# Patient Record
Sex: Male | Born: 1965
Health system: Southern US, Community
[De-identification: ages and names within clinical notes are randomized; demographics above are authoritative.]

## PROBLEM LIST (undated history)

## (undated) DIAGNOSIS — K219 Gastro-esophageal reflux disease without esophagitis: Secondary | ICD-10-CM

## (undated) DIAGNOSIS — I2510 Atherosclerotic heart disease of native coronary artery without angina pectoris: Secondary | ICD-10-CM

## (undated) DIAGNOSIS — R55 Syncope and collapse: Secondary | ICD-10-CM

## (undated) DIAGNOSIS — I25810 Atherosclerosis of coronary artery bypass graft(s) without angina pectoris: Secondary | ICD-10-CM

## (undated) HISTORY — PX: TONSILLECTOMY: SUR1361

## (undated) HISTORY — DX: Syncope and collapse: R55

## (undated) HISTORY — DX: Atherosclerosis of coronary artery bypass graft(s) without angina pectoris: I25.810

## (undated) HISTORY — PX: CORONARY ANGIOPLASTY: SHX604

---

## 1998-12-11 DIAGNOSIS — I219 Acute myocardial infarction, unspecified: Secondary | ICD-10-CM | POA: Insufficient documentation

## 1998-12-11 HISTORY — PX: STENT PLACEMENT VASCULAR (ARMC HX): HXRAD1737

## 1998-12-11 HISTORY — DX: Acute myocardial infarction, unspecified: I21.9

## 1998-12-22 ENCOUNTER — Ambulatory Visit (HOSPITAL_COMMUNITY): Admission: RE | Admit: 1998-12-22 | Discharge: 1998-12-22 | Payer: Self-pay

## 2020-06-10 ENCOUNTER — Encounter: Payer: Self-pay | Admitting: Cardiology

## 2020-06-10 ENCOUNTER — Encounter: Payer: Self-pay | Admitting: Emergency Medicine

## 2020-06-10 ENCOUNTER — Other Ambulatory Visit: Payer: Self-pay

## 2020-06-10 ENCOUNTER — Ambulatory Visit: Payer: Commercial Managed Care - PPO | Admitting: Cardiology

## 2020-06-10 DIAGNOSIS — F17200 Nicotine dependence, unspecified, uncomplicated: Secondary | ICD-10-CM

## 2020-06-10 DIAGNOSIS — I10 Essential (primary) hypertension: Secondary | ICD-10-CM

## 2020-06-10 DIAGNOSIS — I255 Ischemic cardiomyopathy: Secondary | ICD-10-CM

## 2020-06-10 DIAGNOSIS — I2510 Atherosclerotic heart disease of native coronary artery without angina pectoris: Secondary | ICD-10-CM | POA: Insufficient documentation

## 2020-06-10 HISTORY — DX: Ischemic cardiomyopathy: I25.5

## 2020-06-10 HISTORY — DX: Atherosclerotic heart disease of native coronary artery without angina pectoris: I25.10

## 2020-06-10 HISTORY — DX: Essential (primary) hypertension: I10

## 2020-06-10 HISTORY — DX: Nicotine dependence, unspecified, uncomplicated: F17.200

## 2020-06-10 LAB — LIPID PANEL
Chol/HDL Ratio: 6.1 ratio — ABNORMAL HIGH (ref 0.0–5.0)
Cholesterol, Total: 194 mg/dL (ref 100–199)
HDL: 32 mg/dL — ABNORMAL LOW (ref >39)
LDL Chol Calc (NIH): 123 mg/dL — ABNORMAL HIGH (ref 0–99)
Triglycerides: 221 mg/dL — ABNORMAL HIGH (ref 0–149)
VLDL Cholesterol Cal: 39 mg/dL (ref 5–40)

## 2020-06-10 LAB — BASIC METABOLIC PANEL
BUN/Creatinine Ratio: 9 (ref 9–20)
BUN: 8 mg/dL (ref 6–24)
CO2: 21 mmol/L (ref 20–29)
Calcium: 9.5 mg/dL (ref 8.7–10.2)
Chloride: 103 mmol/L (ref 96–106)
Creatinine, Ser: 0.89 mg/dL (ref 0.76–1.27)
GFR calc Af Amer: 113 mL/min/{1.73_m2} (ref >59)
GFR calc non Af Amer: 98 mL/min/{1.73_m2} (ref >59)
Glucose: 100 mg/dL — ABNORMAL HIGH (ref 65–99)
Potassium: 4.2 mmol/L (ref 3.5–5.2)
Sodium: 140 mmol/L (ref 134–144)

## 2020-06-10 MED ORDER — ASPIRIN EC 81 MG PO TBEC
81.0000 mg | DELAYED_RELEASE_TABLET | Freq: Every day | ORAL | 3 refills | Status: AC
Start: 2020-06-10 — End: ?

## 2020-06-10 MED ORDER — ROSUVASTATIN CALCIUM 20 MG PO TABS
20.0000 mg | ORAL_TABLET | Freq: Every day | ORAL | 1 refills | Status: DC
Start: 2020-06-10 — End: 2020-06-15

## 2020-06-10 NOTE — Patient Instructions (Signed)
Medication Instructions:  Your physician has recommended you make the following change in your medication:   DECREASE: Aspirin 81 mg daily   START: Crestor 20 mg daily    *If you need a refill on your cardiac medications before your next appointment, please call your pharmacy*   Lab Work: Your physician recommends that you return for lab work today: bmp, lipid   If you have labs (blood work) drawn today and your tests are completely normal, you will receive your results only by: Marland Kitchen MyChart Message (if you have MyChart) OR . A paper copy in the mail If you have any lab test that is abnormal or we need to change your treatment, we will call you to review the results.   Testing/Procedures: None.    Follow-Up: At [facility], you and your health needs are our priority.  As part of our continuing mission to provide you with exceptional heart care, we have created designated Provider Care Teams.  These Care Teams include your primary Cardiologist (physician) and Advanced Practice Providers (APPs -  Physician Assistants and Nurse Practitioners) who all work together to provide you with the care you need, when you need it.  We recommend signing up for the patient portal called "MyChart".  Sign up information is provided on this After Visit Summary.  MyChart is used to connect with patients for Virtual Visits (Telemedicine).  Patients are able to view lab/test results, encounter notes, upcoming appointments, etc.  Non-urgent messages can be sent to your provider as well.   To learn more about what you can do with MyChart, go to [email or web address].    Your next appointment:   1 month(s)  The format for your next appointment:   In Person  Provider:   Gypsy Balsam, MD   Other Instructions  Rosuvastatin Tablets What is this medicine? ROSUVASTATIN (roe SOO va sta tin) is known as a HMG-CoA reductase inhibitor or 'statin'. It lowers cholesterol and triglycerides in the blood.  This drug may also reduce the risk of heart attack, stroke, or other health problems in patients with risk factors for heart disease. Diet and lifestyle changes are often used with this drug. This medicine may be used for other purposes; ask your health care provider or pharmacist if you have questions. COMMON BRAND NAME(S): Crestor What should I tell my health care provider before I take this medicine? They need to know if you have any of these conditions:  diabetes  if you often drink alcohol  history of stroke  kidney disease  liver disease  muscle aches or weakness  thyroid disease  an unusual or allergic reaction to rosuvastatin, other medicines, foods, dyes, or preservatives  pregnant or trying to get pregnant  breast-feeding How should I use this medicine? Take this medicine by mouth with a glass of water. Follow the directions on the prescription label. Do not cut, crush or chew this medicine. You can take this medicine with or without food. Take your doses at regular intervals. Do not take your medicine more often than directed. Talk to your pediatrician regarding the use of this medicine in children. While this drug may be prescribed for children as young as 57 years old for selected conditions, precautions do apply. Overdosage: If you think you have taken too much of this medicine contact a poison control center or emergency room at once. NOTE: This medicine is only for you. Do not share this medicine with others. What if I miss a dose? If you  miss a dose, take it as soon as you can. If your next dose is to be taken in less than 12 hours, then do not take the missed dose. Take the next dose at your regular time. Do not take double or extra doses. What may interact with this medicine? Do not take this medicine with any of the following medications:  herbal medicines like red yeast rice This medicine may also interact with the following medications:  alcohol  antacids  containing aluminum hydroxide or magnesium hydroxide  cyclosporine  other medicines for high cholesterol  some medicines for HIV infection  warfarin This list may not describe all possible interactions. Give your health care provider a list of all the medicines, herbs, non-prescription drugs, or dietary supplements you use. Also tell them if you smoke, drink alcohol, or use illegal drugs. Some items may interact with your medicine. What should I watch for while using this medicine? Visit your doctor or health care professional for regular check-ups. You may need regular tests to make sure your liver is working properly. Your health care professional may tell you to stop taking this medicine if you develop muscle problems. If your muscle problems do not go away after stopping this medicine, contact your health care professional. Do not become pregnant while taking this medicine. Women should inform their health care professional if they wish to become pregnant or think they might be pregnant. There is a potential for serious side effects to an unborn child. Talk to your health care professional or pharmacist for more information. Do not breast-feed an infant while taking this medicine. This medicine may increase blood sugar. Ask your healthcare provider if changes in diet or medicines are needed if you have diabetes. If you are going to need surgery or other procedure, tell your doctor that you are using this medicine. This drug is only part of a total heart-health program. Your doctor or a dietician can suggest a low-cholesterol and low-fat diet to help. Avoid alcohol and smoking, and keep a proper exercise schedule. This medicine may cause a decrease in Co-Enzyme Q-10. You should make sure that you get enough Co-Enzyme Q-10 while you are taking this medicine. Discuss the foods you eat and the vitamins you take with your health care professional. What side effects may I notice from receiving this  medicine? Side effects that you should report to your doctor or health care professional as soon as possible:  allergic reactions like skin rash, itching or hives, swelling of the face, lips, or tongue  confusion  joint pain  loss of memory  redness, blistering, peeling or loosening of the skin, including inside the mouth  signs and symptoms of high blood sugar such as being more thirsty or hungry or having to urinate more than normal. You may also feel very tired or have blurry vision.  signs and symptoms of muscle injury like dark urine; trouble passing urine or change in the amount of urine; unusually weak or tired; muscle pain or side or back pain  yellowing of the eyes or skin Side effects that usually do not require medical attention (report to your doctor or health care professional if they continue or are bothersome):  constipation  diarrhea  dizziness  gas  headache  nausea  stomach pain  trouble sleeping  upset stomach This list may not describe all possible side effects. Call your doctor for medical advice about side effects. You may report side effects to FDA at 1-800-FDA-1088. Where should  I keep my medicine? Keep out of the reach of children. Store at room temperature between 20 and 25 degrees C (68 and 77 degrees F). Keep container tightly closed (protect from moisture). Throw away any unused medicine after the expiration date. NOTE: This sheet is a summary. It may not cover all possible information. If you have questions about this medicine, talk to your doctor, pharmacist, or health care provider.  2020 Elsevier/Gold Standard (2018-09-19 08:25:08)

## 2020-06-10 NOTE — Progress Notes (Signed)
Cardiology Consultation:    Date:  06/10/2020   ID:  Calvin Rodriguez, DOB 1966-06-22, MRN [id number]  PCP:  Bonnita Nasuti, MD  Cardiologist:  Jenne Campus, MD   Referring MD: Zoila Shutter, NP   Chief Complaint  Patient presents with  . New Patient (Initial Visit)  I would like to be reestablished as a patient  History of Present Illness:    Calvin Rodriguez is a 54 y.o. male who is being seen today for the evaluation of coronary artery disease at the request of Zoila Shutter, NP.  I met Calvin Rodriguez first time more than 20 years ago.  He presented in the middle of the night at age of 50 with acute STEMI involving LAD.  He was transferred to [facility] in PT[facility] and stenting of proximal LAD has been done.  After that he follow-up with me but then he disappeared from follow-up about 6 years ago.  He is coming back to my office he would like to be establish as a patient in the office.  Since I seen him 20 years ago he started exercising on the regular basis he lost significant length of weight however his lack of on that and he is gaining weight again.  Sadly he still continues to smoke.  Overall he seems to be doing well described to have some exertional shortness of breath, but no chest pain, tightness, pressure, burning in the chest.  Past Medical History:  Diagnosis Date  . Heart attack ([place]) 12/1998    Past Surgical History:  Procedure Laterality Date  . STENT PLACEMENT VASCULAR ([facility] HX)  12/1998    Current Medications: Current Meds  Medication Sig  . sacubitril-valsartan (ENTRESTO) 24-26 MG Take 1 tablet by mouth 2 (two) times daily.  . [DISCONTINUED] aspirin 325 MG tablet Take 325 mg by mouth daily.     Allergies:   Patient has no known allergies.   Social History   Socioeconomic History  . Marital status: Single    Spouse name: Not on file  . Number of children: Not on file  . Years of education: Not on file  . Highest education  level: Not on file  Occupational History  . Not on file  Tobacco Use  . Smoking status: Current Every Day Smoker    Packs/day: 0.75    Types: Cigarettes  . Smokeless tobacco: Never Used  Vaping Use  . Vaping Use: Never used  Substance and Sexual Activity  . Alcohol use: Never  . Drug use: Never  . Sexual activity: Not on file  Other Topics Concern  . Not on file  Social History Narrative  . Not on file   Social Determinants of Health   Financial Resource Strain:   . Difficulty of Paying Living Expenses:   Food Insecurity:   . Worried About Charity fundraiser in the Last Year:   . Arboriculturist in the Last Year:   Transportation Needs:   . Film/video editor (Medical):   Marland Kitchen Lack of Transportation (Non-Medical):   Physical Activity:   . Days of Exercise per Week:   . Minutes of Exercise per Session:   Stress:   . Feeling of Stress :   Social Connections:   . Frequency of Communication with Friends and Family:   . Frequency of Social Gatherings with Friends and Family:   . Attends Religious Services:   . Active Member of Clubs or  Organizations:   . Attends Archivist Meetings:   Marland Kitchen Marital Status:      Family History: The patient's significant for premature coronary artery disease.  I was taking care of his father who passed in 2019.  He did have extensive coronary artery disease with bypass surgery valve replacement carotic endarterectomy and other problems. ROS:   Please see the history of present illness.    All 14 point review of systems negative except as described per history of present illness.  EKGs/Labs/Other Studies Reviewed:    The following studies were reviewed today: Apparently echocardiogram has been done which I do not have.  We will request a copy of it  EKG:  EKG is  ordered today.  The ekg ordered today demonstrates normal sinus rhythm left axis deviation nonspecific intraventricular conduction delay, Q waves in V1 V2  Recent  Labs: No results found for requested labs within last 8760 hours.  Recent Lipid Panel No results found for: CHOL, TRIG, HDL, CHOLHDL, VLDL, LDLCALC, LDLDIRECT  Physical Exam:    VS:  BP (!) 154/92 (BP Location: Right Arm, Patient Position: Sitting, Cuff Size: Normal)   Pulse 78   Ht _0  (1.753 m)   Wt 191 lb 6.4 oz (86.8 kg)   SpO2 97%   BMI 28.26 kg/m     Wt Readings from Last 3 Encounters:  06/10/20 191 lb 6.4 oz (86.8 kg)     GEN:  Well nourished, well developed in no acute distress HEENT: Normal NECK: No JVD; No carotid bruits LYMPHATICS: No lymphadenopathy CARDIAC: RRR, no murmurs, no rubs, no gallops RESPIRATORY:  Clear to auscultation without rales, wheezing or rhonchi  ABDOMEN: Soft, non-tender, non-distended MUSCULOSKELETAL:  No edema; No deformity  SKIN: Warm and dry NEUROLOGIC:  Alert and oriented x 3 PSYCHIATRIC:  Normal affect   ASSESSMENT:    1. Coronary artery disease involving native coronary artery of native heart without angina pectoris   2. Ischemic cardiomyopathy ejection fraction 30 to 35% as per patient   3. Smoking   4. Essential hypertension    PLAN:    In order of problems listed above:  1. Coronary artery disease status post PTCA and stenting of proximal LAD 20 years ago.  He is on 325 mg aspirin I will lower the dose to recommend a dose of 81 mg daily, he is also on Entresto 2426.  I will call primary care physician to get copy of his echocardiogram to recheck left ventricle ejection fraction.  He will also required addition of beta-blocker, however, I will make a decision about this after I review his echocardiogram as well as review his laboratory test.  The goal will be to put him on guideline directed medical therapy.  He need to be on 81 mg of aspirin, high intensity statin, Delene Loll is a good choice, beta-blocker.  We will gradually put him on appropriate medications.  After a few months we will repeat his echocardiogram to recheck left  ventricle ejection fraction.  If his left ventricle ejection fraction still in the neighborhood of 30 to 35% ICD need to be contemplated. 2. Smoking obviously huge problem.  He understands an issue he tried to quit he cut down significantly number of cigarettes to smoke but still ultimate goal is completely discontinuation of smoking.  We had a long discussion about this and he understand that. 3. Ischemic cardiomyopathy again plan as outlined above.  He will require beta-blocker. 4. Essential hypertension elevated today we will  check Chem-7 today as well as fasting lipid profile to decide about therapy. 5. Dyslipidemia I will initiate his Crestor 20 mg daily.   Medication Adjustments/Labs and Tests Ordered: Current medicines are reviewed at length with the patient today.  Concerns regarding medicines are outlined above.  Orders Placed This Encounter  Procedures  . Basic metabolic panel  . Lipid panel  . EKG 12-Lead   Meds ordered this encounter  Medications  . aspirin EC 81 MG tablet    Sig: Take 1 tablet (81 mg total) by mouth daily. Swallow whole.    Dispense:  90 tablet    Refill:  3  . rosuvastatin (CRESTOR) 20 MG tablet    Sig: Take 1 tablet (20 mg total) by mouth daily.    Dispense:  90 tablet    Refill:  1    Signed, Park Liter, MD, [facility]. 06/10/2020 9:22 AM    [place]

## 2020-06-15 ENCOUNTER — Telehealth: Payer: Self-pay | Admitting: Emergency Medicine

## 2020-06-15 DIAGNOSIS — I2510 Atherosclerotic heart disease of native coronary artery without angina pectoris: Secondary | ICD-10-CM

## 2020-06-15 MED ORDER — ROSUVASTATIN CALCIUM 40 MG PO TABS: 40.0000 mg | ORAL_TABLET | Freq: Every day | ORAL | 1 refills | Status: DC

## 2020-06-15 NOTE — Telephone Encounter (Signed)
Called patient and informed him of results and to increase crestor to 40 mg daily and that labs will need to be repeated in 6 weeks. Patient verbally understood. No further questions.

## 2020-07-15 ENCOUNTER — Ambulatory Visit: Payer: Commercial Managed Care - PPO | Admitting: Cardiology

## 2020-08-04 ENCOUNTER — Telehealth: Payer: Self-pay | Admitting: Cardiology

## 2020-08-04 NOTE — Telephone Encounter (Signed)
We can do Chem-7 and proBNP before he gets tomorrow, we also can do CBC

## 2020-08-04 NOTE — Telephone Encounter (Signed)
Called patient, no answer and no voicemail. Will continue efforts.

## 2020-08-04 NOTE — Telephone Encounter (Signed)
    Pt c/o medication issue:  1. Name of Medication:   sacubitril-valsartan (ENTRESTO) 24-26 MG    2. How are you currently taking this medication (dosage and times per day)?   3. Are you having a reaction (difficulty breathing--STAT)?   4. What is your medication issue? Pt's pcp prescribed entresto, when he started taking it he felt weak and really sick, he called pcp and stopped entresto and ask him to call Dr. Kirtland Bouchard, pcp said that his potassium might be affected. They schedule appt tomorrow to see DR. Kirtland Bouchard and would like to know if pt needs blood work.

## 2020-08-05 ENCOUNTER — Encounter (HOSPITAL_BASED_OUTPATIENT_CLINIC_OR_DEPARTMENT_OTHER): Payer: Self-pay | Admitting: Emergency Medicine

## 2020-08-05 ENCOUNTER — Encounter: Payer: Self-pay | Admitting: Cardiology

## 2020-08-05 ENCOUNTER — Inpatient Hospital Stay (HOSPITAL_BASED_OUTPATIENT_CLINIC_OR_DEPARTMENT_OTHER)
Admission: EM | Admit: 2020-08-05 | Discharge: 2020-08-13 | DRG: 234 | Disposition: A | Discharge status: Home / Self Care | Payer: Commercial Managed Care - PPO | Source: Ambulatory Visit | Attending: Cardiothoracic Surgery | Admitting: Cardiothoracic Surgery

## 2020-08-05 ENCOUNTER — Ambulatory Visit: Payer: Commercial Managed Care - PPO | Admitting: Cardiology

## 2020-08-05 ENCOUNTER — Other Ambulatory Visit: Payer: Self-pay

## 2020-08-05 ENCOUNTER — Emergency Department (HOSPITAL_BASED_OUTPATIENT_CLINIC_OR_DEPARTMENT_OTHER): Payer: Commercial Managed Care - PPO

## 2020-08-05 VITALS — BP 120/90 | HR 100 | Ht 69.0 in | Wt 183.0 lb

## 2020-08-05 DIAGNOSIS — I5041 Acute combined systolic (congestive) and diastolic (congestive) heart failure: Secondary | ICD-10-CM | POA: Diagnosis not present

## 2020-08-05 DIAGNOSIS — I25118 Atherosclerotic heart disease of native coronary artery with other forms of angina pectoris: Secondary | ICD-10-CM | POA: Diagnosis not present

## 2020-08-05 DIAGNOSIS — Z20822 Contact with and (suspected) exposure to covid-19: Secondary | ICD-10-CM | POA: Diagnosis present

## 2020-08-05 DIAGNOSIS — M25512 Pain in left shoulder: Secondary | ICD-10-CM | POA: Diagnosis present

## 2020-08-05 DIAGNOSIS — I2584 Coronary atherosclerosis due to calcified coronary lesion: Secondary | ICD-10-CM | POA: Diagnosis present

## 2020-08-05 DIAGNOSIS — I255 Ischemic cardiomyopathy: Secondary | ICD-10-CM | POA: Diagnosis present

## 2020-08-05 DIAGNOSIS — I25110 Atherosclerotic heart disease of native coronary artery with unstable angina pectoris: Secondary | ICD-10-CM | POA: Diagnosis not present

## 2020-08-05 DIAGNOSIS — Z951 Presence of aortocoronary bypass graft: Secondary | ICD-10-CM

## 2020-08-05 DIAGNOSIS — I10 Essential (primary) hypertension: Secondary | ICD-10-CM | POA: Diagnosis not present

## 2020-08-05 DIAGNOSIS — R000 Tachycardia, unspecified: Secondary | ICD-10-CM | POA: Diagnosis not present

## 2020-08-05 DIAGNOSIS — F17200 Nicotine dependence, unspecified, uncomplicated: Secondary | ICD-10-CM | POA: Diagnosis not present

## 2020-08-05 DIAGNOSIS — Z01810 Encounter for preprocedural cardiovascular examination: Secondary | ICD-10-CM | POA: Diagnosis not present

## 2020-08-05 DIAGNOSIS — I214 Non-ST elevation (NSTEMI) myocardial infarction: Secondary | ICD-10-CM | POA: Diagnosis present

## 2020-08-05 DIAGNOSIS — R55 Syncope and collapse: Secondary | ICD-10-CM

## 2020-08-05 DIAGNOSIS — G8929 Other chronic pain: Secondary | ICD-10-CM | POA: Diagnosis present

## 2020-08-05 DIAGNOSIS — Z955 Presence of coronary angioplasty implant and graft: Secondary | ICD-10-CM | POA: Diagnosis not present

## 2020-08-05 DIAGNOSIS — R7303 Prediabetes: Secondary | ICD-10-CM | POA: Diagnosis present

## 2020-08-05 DIAGNOSIS — F17210 Nicotine dependence, cigarettes, uncomplicated: Secondary | ICD-10-CM | POA: Diagnosis present

## 2020-08-05 DIAGNOSIS — K219 Gastro-esophageal reflux disease without esophagitis: Secondary | ICD-10-CM | POA: Diagnosis present

## 2020-08-05 DIAGNOSIS — I5022 Chronic systolic (congestive) heart failure: Secondary | ICD-10-CM | POA: Diagnosis present

## 2020-08-05 DIAGNOSIS — I110 Hypertensive heart disease with heart failure: Secondary | ICD-10-CM | POA: Diagnosis present

## 2020-08-05 DIAGNOSIS — F17203 Nicotine dependence unspecified, with withdrawal: Secondary | ICD-10-CM | POA: Diagnosis not present

## 2020-08-05 DIAGNOSIS — I340 Nonrheumatic mitral (valve) insufficiency: Secondary | ICD-10-CM | POA: Diagnosis not present

## 2020-08-05 DIAGNOSIS — Z8249 Family history of ischemic heart disease and other diseases of the circulatory system: Secondary | ICD-10-CM | POA: Diagnosis not present

## 2020-08-05 DIAGNOSIS — I952 Hypotension due to drugs: Secondary | ICD-10-CM | POA: Diagnosis present

## 2020-08-05 DIAGNOSIS — Z7982 Long term (current) use of aspirin: Secondary | ICD-10-CM | POA: Diagnosis not present

## 2020-08-05 DIAGNOSIS — I509 Heart failure, unspecified: Secondary | ICD-10-CM | POA: Diagnosis not present

## 2020-08-05 DIAGNOSIS — E785 Hyperlipidemia, unspecified: Secondary | ICD-10-CM | POA: Diagnosis present

## 2020-08-05 DIAGNOSIS — I2510 Atherosclerotic heart disease of native coronary artery without angina pectoris: Secondary | ICD-10-CM | POA: Diagnosis present

## 2020-08-05 DIAGNOSIS — I252 Old myocardial infarction: Secondary | ICD-10-CM

## 2020-08-05 DIAGNOSIS — D62 Acute posthemorrhagic anemia: Secondary | ICD-10-CM | POA: Diagnosis present

## 2020-08-05 DIAGNOSIS — R110 Nausea: Secondary | ICD-10-CM | POA: Diagnosis not present

## 2020-08-05 HISTORY — DX: Non-ST elevation (NSTEMI) myocardial infarction: I21.4

## 2020-08-05 HISTORY — DX: Atherosclerotic heart disease of native coronary artery without angina pectoris: I25.10

## 2020-08-05 HISTORY — DX: Gastro-esophageal reflux disease without esophagitis: K21.9

## 2020-08-05 LAB — TROPONIN T: Troponin T (Highly Sensitive): 2772 ng/L (ref 0–22)

## 2020-08-05 LAB — CBC
HCT: 44.2 % (ref 39.0–52.0)
Hemoglobin: 14.9 g/dL (ref 13.0–17.0)
MCH: 31.6 pg (ref 26.0–34.0)
MCHC: 33.7 g/dL (ref 30.0–36.0)
MCV: 93.8 fL (ref 80.0–100.0)
Platelets: 324 10*3/uL (ref 150–400)
RBC: 4.71 MIL/uL (ref 4.22–5.81)
RDW: 13.4 % (ref 11.5–15.5)
WBC: 9.6 10*3/uL (ref 4.0–10.5)
nRBC: 0 % (ref 0.0–0.2)

## 2020-08-05 LAB — BASIC METABOLIC PANEL
Anion gap: 12 (ref 5–15)
BUN/Creatinine Ratio: 16 (ref 9–20)
BUN: 13 mg/dL (ref 6–24)
BUN: 13 mg/dL (ref 6–20)
CO2: 30 mmol/L — ABNORMAL HIGH (ref 20–29)
CO2: 25 mmol/L (ref 22–32)
Calcium: 9.7 mg/dL (ref 8.7–10.2)
Calcium: 8.9 mg/dL (ref 8.9–10.3)
Chloride: 102 mmol/L (ref 96–106)
Chloride: 103 mmol/L (ref 98–111)
Creatinine, Ser: 0.82 mg/dL (ref 0.76–1.27)
Creatinine, Ser: 0.89 mg/dL (ref 0.61–1.24)
GFR calc Af Amer: 117 mL/min/{1.73_m2} (ref >59)
GFR calc Af Amer: >60 mL/min (ref >60)
GFR calc non Af Amer: 101 mL/min/{1.73_m2} (ref >59)
GFR calc non Af Amer: >60 mL/min (ref >60)
Glucose, Bld: 106 mg/dL — ABNORMAL HIGH (ref 70–99)
Glucose: 107 mg/dL — ABNORMAL HIGH (ref 65–99)
Potassium: 4 mmol/L (ref 3.5–5.2)
Potassium: 3.3 mmol/L — ABNORMAL LOW (ref 3.5–5.1)
Sodium: 141 mmol/L (ref 134–144)
Sodium: 140 mmol/L (ref 135–145)

## 2020-08-05 LAB — HEMOGLOBIN A1C
Hgb A1c MFr Bld: 5.8 % — ABNORMAL HIGH (ref 4.8–5.6)
Mean Plasma Glucose: 119.76 mg/dL

## 2020-08-05 LAB — SARS CORONAVIRUS 2 BY RT PCR (HOSPITAL ORDER, PERFORMED IN ~~LOC~~ HOSPITAL LAB): SARS Coronavirus 2: NEGATIVE

## 2020-08-05 LAB — TROPONIN I (HIGH SENSITIVITY)
Troponin I (High Sensitivity): 9076 ng/L (ref <18)
Troponin I (High Sensitivity): 9897 ng/L (ref <18)

## 2020-08-05 LAB — TSH: TSH: 1.874 u[IU]/mL (ref 0.350–4.500)

## 2020-08-05 LAB — BRAIN NATRIURETIC PEPTIDE: B Natriuretic Peptide: 422.9 pg/mL — ABNORMAL HIGH (ref 0.0–100.0)

## 2020-08-05 LAB — HEPARIN LEVEL (UNFRACTIONATED): Heparin Unfractionated: 0.11 IU/mL — ABNORMAL LOW (ref 0.30–0.70)

## 2020-08-05 LAB — MAGNESIUM: Magnesium: 2.2 mg/dL (ref 1.6–2.3)

## 2020-08-05 MED ORDER — ALPRAZOLAM 0.25 MG PO TABS: 0.2500 mg | ORAL_TABLET | Freq: Two times a day (BID) | ORAL | Status: DC | PRN

## 2020-08-05 MED ORDER — SODIUM CHLORIDE 0.9 % WEIGHT BASED INFUSION
3.0000 mL/kg/h | INTRAVENOUS | Status: DC
  Administered 2020-08-06: 3 mL/kg/h via INTRAVENOUS

## 2020-08-05 MED ORDER — ASPIRIN EC 81 MG PO TBEC
81.0000 mg | DELAYED_RELEASE_TABLET | Freq: Every day | ORAL | Status: DC
  Administered 2020-08-07 – 2020-08-08 (×2): 81 mg via ORAL
  Filled 2020-08-05 (×2): qty 1

## 2020-08-05 MED ORDER — ASPIRIN 81 MG PO CHEW
81.0000 mg | CHEWABLE_TABLET | ORAL | Status: AC
  Administered 2020-08-06: 81 mg via ORAL
  Filled 2020-08-05: qty 1

## 2020-08-05 MED ORDER — SODIUM CHLORIDE 0.9 % WEIGHT BASED INFUSION
1.0000 mL/kg/h | INTRAVENOUS | Status: DC
  Administered 2020-08-06: 1 mL/kg/h via INTRAVENOUS

## 2020-08-05 MED ORDER — ZOLPIDEM TARTRATE 5 MG PO TABS: 5.0000 mg | ORAL_TABLET | Freq: Every evening | ORAL | Status: DC | PRN

## 2020-08-05 MED ORDER — SODIUM CHLORIDE 0.9% FLUSH
3.0000 mL | Freq: Two times a day (BID) | INTRAVENOUS | Status: DC
  Administered 2020-08-06 – 2020-08-08 (×2): 3 mL via INTRAVENOUS

## 2020-08-05 MED ORDER — SODIUM CHLORIDE 0.9 % IV SOLN: 250.0000 mL | INTRAVENOUS | Status: DC | PRN

## 2020-08-05 MED ORDER — ONDANSETRON HCL 4 MG/2ML IJ SOLN: 4.0000 mg | Freq: Four times a day (QID) | INTRAMUSCULAR | Status: DC | PRN

## 2020-08-05 MED ORDER — NITROGLYCERIN 0.4 MG SL SUBL: 0.4000 mg | SUBLINGUAL_TABLET | SUBLINGUAL | Status: DC | PRN

## 2020-08-05 MED ORDER — HEPARIN BOLUS VIA INFUSION
2000.0000 [IU] | Freq: Once | INTRAVENOUS | Status: AC
  Administered 2020-08-05: 2000 [IU] via INTRAVENOUS
  Filled 2020-08-05: qty 2000

## 2020-08-05 MED ORDER — ATORVASTATIN CALCIUM 80 MG PO TABS
80.0000 mg | ORAL_TABLET | Freq: Every day | ORAL | Status: DC
  Administered 2020-08-06 – 2020-08-13 (×5): 80 mg via ORAL
  Filled 2020-08-05 (×7): qty 1

## 2020-08-05 MED ORDER — ACETAMINOPHEN 325 MG PO TABS: 650.0000 mg | ORAL_TABLET | ORAL | Status: DC | PRN

## 2020-08-05 MED ORDER — ASPIRIN 81 MG PO CHEW
324.0000 mg | CHEWABLE_TABLET | Freq: Once | ORAL | Status: AC
  Administered 2020-08-05: 324 mg via ORAL
  Filled 2020-08-05: qty 4

## 2020-08-05 MED ORDER — CARVEDILOL 3.125 MG PO TABS
3.1250 mg | ORAL_TABLET | Freq: Two times a day (BID) | ORAL | Status: DC
  Administered 2020-08-06 – 2020-08-08 (×5): 3.125 mg via ORAL
  Filled 2020-08-05 (×6): qty 1

## 2020-08-05 MED ORDER — HEPARIN (PORCINE) 25000 UT/250ML-% IV SOLN
1200.0000 [IU]/h | INTRAVENOUS | Status: DC
  Administered 2020-08-05: 1000 [IU]/h via INTRAVENOUS
  Filled 2020-08-05 (×2): qty 250

## 2020-08-05 MED ORDER — SODIUM CHLORIDE 0.9% FLUSH: 3.0000 mL | INTRAVENOUS | Status: DC | PRN

## 2020-08-05 MED ORDER — HEPARIN BOLUS VIA INFUSION
4000.0000 [IU] | Freq: Once | INTRAVENOUS | Status: AC
  Administered 2020-08-05: 4000 [IU] via INTRAVENOUS

## 2020-08-05 MED ORDER — POTASSIUM CHLORIDE CRYS ER 20 MEQ PO TBCR
40.0000 meq | EXTENDED_RELEASE_TABLET | Freq: Once | ORAL | Status: AC
  Administered 2020-08-05: 40 meq via ORAL
  Filled 2020-08-05: qty 2

## 2020-08-05 NOTE — Addendum Note (Signed)
Addended by: Vernard Gambles on: 08/05/2020 03:07 PM   Modules accepted: Orders

## 2020-08-05 NOTE — Progress Notes (Addendum)
Cardiology Office Note:    Date:  08/05/2020   ID:  Calvin Rodriguez, DOB 1965/12/17, MRN [id number]  PCP:  Galvin Proffer, MD  Cardiologist:  Gypsy Balsam, MD    Referring MD: Galvin Proffer, MD   Chief Complaint  Patient presents with  . Medication Problem    Entresto/muscle weakness, tired alot,   I passed out on Monday  History of Present Illness:    Calvin Rodriguez is a 54 y.o. male with complex past medical history.  About 20 years ago he did have myocardial infarction, and that have been managing him for years then he disappeared from follow-up and then showed up again in my office about 2 months ago.  He was off all medications.  Repeated echocardiogram showed diminished ejection fraction 30 to 35%.  I started him on Entresto and then he call us on Monday that he passed out.  He said he did not feel well he did have some shoulder pain that he always has and then he was in the restroom he urinated and he was shaving himself and then next thing he knew he was on the floor then couple hours later he had another episode.  He called primary care physician who told him to stop Entresto thinking that maybe Sherryll Burger is causing the problem.  Since that time he seems to be doing well, denies have any chest pain tightness squeezing pressure burning chest no dizziness no palpitations.  He is determined to quit smoking.  1:42 PM: I just received notification from the laboratory that his troponin is elevated.  He is troponin is actually 2772.  I called the patient he is completely asymptomatic I instructed him to go to the emergency room.  Past Medical History:  Diagnosis Date  . Heart attack ([facility]) 12/1998    Past Surgical History:  Procedure Laterality Date  . STENT PLACEMENT VASCULAR ([facility] HX)  12/1998    Current Medications: Current Meds  Medication Sig  . aspirin EC 81 MG tablet Take 1 tablet (81 mg total) by mouth daily. Swallow whole.  . Multiple Vitamins-Minerals  (MULTIVITAMIN WITH MINERALS) tablet Take 1 tablet by mouth daily.     Allergies:   Patient has no known allergies.   Social History   Socioeconomic History  . Marital status: Single    Spouse name: Not on file  . Number of children: Not on file  . Years of education: Not on file  . Highest education level: Not on file  Occupational History  . Not on file  Tobacco Use  . Smoking status: Current Every Day Smoker    Packs/day: 0.75    Types: Cigarettes  . Smokeless tobacco: Never Used  Vaping Use  . Vaping Use: Never used  Substance and Sexual Activity  . Alcohol use: Never  . Drug use: Never  . Sexual activity: Not on file  Other Topics Concern  . Not on file  Social History Narrative  . Not on file   Social Determinants of Health   Financial Resource Strain:   . Difficulty of Paying Living Expenses: Not on file  Food Insecurity:   . Worried About Programme researcher, broadcasting/film/video in the Last Year: Not on file  . Ran Out of Food in the Last Year: Not on file  Transportation Needs:   . Lack of Transportation (Medical): Not on file  . Lack of Transportation (Non-Medical): Not on file  Physical Activity:   . Days  of Exercise per Week: Not on file  . Minutes of Exercise per Session: Not on file  Stress:   . Feeling of Stress : Not on file  Social Connections:   . Frequency of Communication with Friends and Family: Not on file  . Frequency of Social Gatherings with Friends and Family: Not on file  . Attends Religious Services: Not on file  . Active Member of Clubs or Organizations: Not on file  . Attends Banker Meetings: Not on file  . Marital Status: Not on file     Family History: The patient's family history includes Heart disease in his father and mother. ROS:   Please see the history of present illness.    All 14 point review of systems negative except as described per history of present illness  EKGs/Labs/Other Studies Reviewed:      Recent  Labs: 06/10/2020: BUN 8; Creatinine, Ser 0.89; Potassium 4.2; Sodium 140  Recent Lipid Panel    Component Value Date/Time   CHOL 194 06/10/2020 0928   TRIG 221 (H) 06/10/2020 0928   HDL 32 (L) 06/10/2020 0928   CHOLHDL 6.1 (H) 06/10/2020 0928   LDLCALC 123 (H) 06/10/2020 7353    Physical Exam:    VS:  BP 120/90 (BP Location: Right Arm, Patient Position: Sitting, Cuff Size: Normal)   Pulse 100   Ht 5\' 9"  (1.753 m)   Wt 183 lb (83 kg)   SpO2 98%   BMI 27.02 kg/m     Wt Readings from Last 3 Encounters:  08/05/20 183 lb (83 kg)  06/10/20 191 lb 6.4 oz (86.8 kg)     GEN:  Well nourished, well developed in no acute distress HEENT: Normal NECK: No JVD; No carotid bruits LYMPHATICS: No lymphadenopathy CARDIAC: RRR, no murmurs, no rubs, no gallops RESPIRATORY:  Clear to auscultation without rales, wheezing or rhonchi  ABDOMEN: Soft, non-tender, non-distended MUSCULOSKELETAL:  No edema; No deformity  SKIN: Warm and dry LOWER EXTREMITIES: no swelling NEUROLOGIC:  Alert and oriented x 3 PSYCHIATRIC:  Normal affect   ASSESSMENT:    1. Ischemic cardiomyopathy ejection fraction 30 to 35% as per patient   2. Essential hypertension   3. Coronary artery disease involving native coronary artery of native heart without angina pectoris   4. Smoking    PLAN:    In order of problems listed above:  1. Ischemic cardiomyopathy with ejection fraction 30 to 35%, we will do EKG today to see if we can initiate beta-blockade.  He did try Entresto however he had some side effects of this including episode of syncope.  Obviously big concern is about arrhythmia.  I will ask EKG today.  We will check his electrolytes including Chem-7 as well as magnesium.  I will also check his troponin I.  We will put him on carvedilol 3.125 twice daily. 2. Essential hypertension blood pressure today is acceptable 120/90 again we will add beta-blockade. 3. Coronary artery disease I will check troponin because of  episode of syncope he had on Monday.  He will most likely require CAD work-up. 4. Smoking: He is determined to quit and he said he is going to quit this week. 5. I really worry about him.  He did have episode of syncope which look like potentially cardiac we will check his electrolytes, will start beta-blockade.  If his ejection fraction not improved with appropriate therapy ICD will be needed. I did a quick look with echocardiogram today to make sure there is  no significant aneurysm.  His echocardiogram showed deterioration of left ventricle ejection fraction now is about 20 to 25%.  Medication Adjustments/Labs and Tests Ordered: Current medicines are reviewed at length with the patient today.  Concerns regarding medicines are outlined above.  No orders of the defined types were placed in this encounter.  Medication changes: No orders of the defined types were placed in this encounter.   Signed, Georgeanna Lea, MD, [facility] 08/05/2020 8:28 AM    [facility]

## 2020-08-05 NOTE — H&P (Signed)
Cardiology Admission History and Physical:   Patient ID: Calvin Rodriguez MRN: [id number]; DOB: 18-Jul-1966   Admission date: 08/05/2020  Primary Care Provider: Galvin Proffer, MD [facility] Cardiologist: Dr Bing Matter [facility] Electrophysiologist:  None   Chief Complaint:  Syncope  Patient Profile:   Calvin Rodriguez is a 54 y.o. male history of CAD and chronic sysotlic HF presents with syncope, significant elevated troponin from outpatient cardioogy clinic visit  History of Present Illness:    History of CAD with remote MI 20 years ago, chronic sysotlic HF LVEF 69-45%, admitted with syncope. Patient seen by Dr Bing Matter in clinic today for evaluation of syncope, thought possibly related to starting entresto recently. As part of workup got an outpatient troponin that came back elevated at 2772. From office note in office limited echo showed LVEF had decreased to 20-25% He was contacted and asked to come to the ER.  Two episodes of syncope on Monday. Had just shaved in the bathroom, was standing at the sink. Next thing he knew he found himself slumped up against the bathtub. No prodrome. No prior lightheadedness or dizziness. Came to he is not sure how long after. Stood up and made a phone call to his boss. Shortly after recurrnet episode, once again no warning or prodromal symptoms. Next thing he knew his wife was standing over him trying to get him to come to. Cold sweat, weak and fatigue. Crawled into bedroom and took a nap. Feld drained on Tuesday, no troubles Wed or Thursday. No recurrent syncope.  At no point any chest pain or SOB. Sunday night had left shoulder pain that is chronic for him, worst with position, lasting several hours. He reports told he has a frozen joint on that side.    K 4 Cr 0.82 Mg 2.2 WBC 9.6 Hgb 14.9 Trop 2772-->9076-->9897 BNP 422 COVID neg EKG SR, LAFB, anteroseptal Qwaves more extensive than prior  02/2020 echo LVEF  30-35%      Past Medical History:  Diagnosis Date  . Heart attack (HCC) 12/1998    Past Surgical History:  Procedure Laterality Date  . STENT PLACEMENT VASCULAR ([facility] HX)  12/1998     Medications Prior to Admission: Prior to Admission medications   Medication Sig Start Date End Date Taking? Authorizing Provider  aspirin EC 81 MG tablet Take 1 tablet (81 mg total) by mouth daily. Swallow whole. 06/10/20   Georgeanna Lea, MD  Multiple Vitamins-Minerals (MULTIVITAMIN WITH MINERALS) tablet Take 1 tablet by mouth daily.    [provider]     Allergies:   No Known Allergies  Social History:   Social History   Socioeconomic History  . Marital status: Single    Spouse name: Not on file  . Number of children: Not on file  . Years of education: Not on file  . Highest education level: Not on file  Occupational History  . Not on file  Tobacco Use  . Smoking status: Current Every Day Smoker    Packs/day: 0.75    Types: Cigarettes  . Smokeless tobacco: Never Used  Vaping Use  . Vaping Use: Never used  Substance and Sexual Activity  . Alcohol use: Never  . Drug use: Never  . Sexual activity: Not on file  Other Topics Concern  . Not on file  Social History Narrative  . Not on file   Social Determinants of Health   Financial Resource Strain:   . Difficulty of Paying Living  Expenses: Not on file  Food Insecurity:   . Worried About Programme researcher, broadcasting/film/video in the Last Year: Not on file  . Ran Out of Food in the Last Year: Not on file  Transportation Needs:   . Lack of Transportation (Medical): Not on file  . Lack of Transportation (Non-Medical): Not on file  Physical Activity:   . Days of Exercise per Week: Not on file  . Minutes of Exercise per Session: Not on file  Stress:   . Feeling of Stress : Not on file  Social Connections:   . Frequency of Communication with Friends and Family: Not on file  . Frequency of Social Gatherings with Friends and Family: Not  on file  . Attends Religious Services: Not on file  . Active Member of Clubs or Organizations: Not on file  . Attends Banker Meetings: Not on file  . Marital Status: Not on file  Intimate Partner Violence:   . Fear of Current or Ex-Partner: Not on file  . Emotionally Abused: Not on file  . Physically Abused: Not on file  . Sexually Abused: Not on file    Family History:   The patient's family history includes Heart disease in his father and mother.    ROS:  Please see the history of present illness.  All other ROS reviewed and negative.     Physical Exam/Data:   Vitals:   08/05/20 1630 08/05/20 1700 08/05/20 1738 08/05/20 1837  BP: 125/82 117/85 122/85 120/82  Pulse: 77 75 83   Resp: (!) 23 14 15 16   Temp:    98.3 F (36.8 C)  TempSrc:    Oral  SpO2: 100% 100% 100% 100%  Weight:    80.7 kg  Height:    5\' 9"  (1.753 m)   No intake or output data in the 24 hours ending 08/05/20 1954 Last 3 Weights 08/05/2020 08/05/2020 08/05/2020  Weight (lbs) 178 lb 183 lb 183 lb  Weight (kg) 80.74 kg 83.008 kg 83.008 kg     Body mass index is 26.29 kg/m.  General:  Well nourished, well developed, in no acute distress HEENT: normal Lymph: no adenopathy Neck: no JVD Endocrine:  No thryomegaly Vascular: No carotid bruits; FA pulses 2+ bilaterally without bruits  Cardiac:  normal S1, S2; RRR; no murmur  Lungs:  clear to auscultation bilaterally, no wheezing, rhonchi or rales  Abd: soft, nontender, no hepatomegaly  Ext: noedema Musculoskeletal:  No deformities, BUE and BLE strength normal and equal Skin: warm and dry  Neuro:  CNs 2-12 intact, no focal abnormalities noted Psych:  Normal affect      Laboratory Data:  High Sensitivity Troponin:   Recent Labs  Lab 08/05/20 1458 08/05/20 1701  TROPONINIHS 9,076* 9,897*      Chemistry Recent Labs  Lab 08/05/20 0914 08/05/20 1458  NA 141 140  K 4.0 3.3*  CL 102 103  CO2 30* 25  GLUCOSE 107* 106*  BUN 13 13   CREATININE 0.82 0.89  CALCIUM 9.7 8.9  GFRNONAA 101 >60  GFRAA 117 >60  ANIONGAP  --  12    No results for input(s): PROT, ALBUMIN, AST, ALT, ALKPHOS, BILITOT in the last 168 hours. Hematology Recent Labs  Lab 08/05/20 1458  WBC 9.6  RBC 4.71  HGB 14.9  HCT 44.2  MCV 93.8  MCH 31.6  MCHC 33.7  RDW 13.4  PLT 324   BNP Recent Labs  Lab 08/05/20 1458  BNP 422.9*  DDimer No results for input(s): DDIMER in the last 168 hours.   Radiology/Studies:  DG Chest 2 View  Result Date: 08/05/2020 CLINICAL DATA:  54 year old male with syncope several days ago, pain radiating to the left shoulder. EXAM: CHEST - 2 VIEW COMPARISON:  Portable chest 06/27/2010. FINDINGS: PA and lateral views. Mild thoracolumbar scoliosis. Cardiac size and mediastinal contours are within normal limits. Visualized tracheal air column is within normal limits. Both lungs appear clear. No pneumothorax or pleural effusion. Degeneration in the spine. No acute osseous abnormality identified. Negative visible bowel gas pattern. IMPRESSION: 1. No cardiopulmonary abnormality. 2. Scoliosis and spine degeneration. Electronically Signed   By: Odessa Fleming M.D.   On: 08/05/2020 15:18   {  Assessment and Plan:   1. NSTEMI - no specific chest pains or SOB. Completley asympmtomatic today.  Seen in clinic for syncopal episodes on Monday, trop was checked and significantly elevated - continues to trend up to 9897 by last check. EKG SR, LAFB, anteroseptal Qwaves.  - bedside clinic echo reports LVEF decreased further to 20-25%  - medical therapy overnight with heparin, ASA 81, atorva 80, coreg 3.125mg  bid. Hold ACE/ARB/ARNI until after cath - cath tomorrow   2. Chronic systolic HF 02/2020 echo LVEF 30-35% - repeat formal echo given report of clinic bedside echo of LVEF decrease to 20-25%   3. Syncope - unclear etiology. Description worrisome for cardiogenic given no prodrome or warning at all - certaintly high risk for  ventricular arrhythmias given evidence of current ischemia, chronic LV dysfunction - tele overnight, echo and cath tomorrow   I have reviewed the risks, indications, and alternatives to cardiac catheterization, possible angioplasty, and stenting with the patient today. Risks include but are not limited to bleeding, infection, vascular injury, stroke, myocardial infection, arrhythmia, kidney injury, radiation-related injury in the case of prolonged fluoroscopy use, emergency cardiac surgery, and death. The patient understands the risks of serious complication is 1-2 in 1000 with diagnostic cardiac cath and 1-2% or less with angioplasty/stenting.    For questions or updates, please contact [facility] Please consult [email or web address] for contact info under     Signed, Dina Rich, MD  08/05/2020 7:54 PM

## 2020-08-05 NOTE — ED Provider Notes (Signed)
[facility] EMERGENCY DEPARTMENT Provider Note   CSN: [id number] Arrival date & time: 08/05/20  1439     History Chief Complaint  Patient presents with  . Loss of Consciousness    Calvin Rodriguez IV Staat is a 54 y.o. male.  Patient with 2 episodes of unprovoked syncope on Monday.  Was seen by cardiologist today and was found to have a troponin greater than 2700.  Not having any chest pain.  Patient according to note did have a decreased EF at 20 to 25%.  Patient has not had any other episodes of syncope and has been overall asymptomatic the last several days.  The history is provided by the patient.  Loss of Consciousness Episode history:  Multiple Most recent episode:  More than 2 days ago Progression:  Resolved Chronicity:  New Relieved by:  Nothing Worsened by:  Nothing Associated symptoms: no chest pain, no fever, no palpitations, no seizures, no shortness of breath and no vomiting        Past Medical History:  Diagnosis Date  . Heart attack [facility]) 12/1998    Patient Active Problem List   Diagnosis Date Noted  . NSTEMI (non-ST elevated myocardial infarction) (HCC) 08/05/2020  . Coronary artery disease 06/10/2020  . Ischemic cardiomyopathy ejection fraction 30 to 35% as per patient 06/10/2020  . Smoking 06/10/2020  . Essential hypertension 06/10/2020    Past Surgical History:  Procedure Laterality Date  . STENT PLACEMENT VASCULAR ([facility] HX)  12/1998       Family History  Problem Relation Age of Onset  . Heart disease Mother   . Heart disease Father     Social History   Tobacco Use  . Smoking status: Current Every Day Smoker    Packs/day: 0.75    Types: Cigarettes  . Smokeless tobacco: Never Used  Vaping Use  . Vaping Use: Never used  Substance Use Topics  . Alcohol use: Never  . Drug use: Never    Home Medications Prior to Admission medications   Medication Sig Start Date End Date Taking? Authorizing Provider  aspirin EC 81 MG  tablet Take 1 tablet (81 mg total) by mouth daily. Swallow whole. 06/10/20   Georgeanna Lea, MD  Multiple Vitamins-Minerals (MULTIVITAMIN WITH MINERALS) tablet Take 1 tablet by mouth daily.    [provider]    Allergies    Patient has no known allergies.  Review of Systems   Review of Systems  Constitutional: Negative for chills and fever.  HENT: Negative for ear pain and sore throat.   Eyes: Negative for pain and visual disturbance.  Respiratory: Negative for cough and shortness of breath.   Cardiovascular: Positive for syncope. Negative for chest pain and palpitations.  Gastrointestinal: Negative for abdominal pain and vomiting.  Genitourinary: Negative for dysuria and hematuria.  Musculoskeletal: Negative for arthralgias and back pain.  Skin: Negative for color change and rash.  Neurological: Positive for syncope. Negative for seizures.  All other systems reviewed and are negative.   Physical Exam Updated Vital Signs  ED Triage Vitals  Enc Vitals Group     BP 08/05/20 1445 118/86     Pulse Rate 08/05/20 1445 97     Resp 08/05/20 1445 18     Temp 08/05/20 1445 99.2 F (37.3 C)     Temp Source 08/05/20 1445 Oral     SpO2 08/05/20 1445 95 %     Weight 08/05/20 1443 183 lb (83 kg)     Height  08/05/20 1443 5\' 9"  (1.753 m)     Head Circumference --      Peak Flow --      Pain Score 08/05/20 1443 0     Pain Loc --      Pain Edu? --      Excl. in GC? --     Physical Exam Vitals and nursing note reviewed.  Constitutional:      General: He is not in acute distress.    Appearance: He is well-developed. He is not ill-appearing.  HENT:     Head: Normocephalic and atraumatic.     Nose: Nose normal.     Mouth/Throat:     Mouth: Mucous membranes are moist.  Eyes:     Extraocular Movements: Extraocular movements intact.     Conjunctiva/sclera: Conjunctivae normal.     Pupils: Pupils are equal, round, and reactive to light.  Cardiovascular:     Rate and  Rhythm: Normal rate and regular rhythm.     Pulses: Normal pulses.     Heart sounds: Normal heart sounds. No murmur heard.   Pulmonary:     Effort: Pulmonary effort is normal. No respiratory distress.     Breath sounds: Normal breath sounds.  Abdominal:     Palpations: Abdomen is soft.     Tenderness: There is no abdominal tenderness.  Musculoskeletal:     Cervical back: Normal range of motion and neck supple.  Skin:    General: Skin is warm and dry.     Capillary Refill: Capillary refill takes less than 2 seconds.  Neurological:     General: No focal deficit present.     Mental Status: He is alert and oriented to person, place, and time.     Cranial Nerves: No cranial nerve deficit.     Sensory: No sensory deficit.     Motor: No weakness.     Coordination: Coordination normal.     ED Results / Procedures / Treatments   Labs (all labs ordered are listed, but only abnormal results are displayed) Labs Reviewed  BASIC METABOLIC PANEL - Abnormal; Notable for the following components:      Result Value   Potassium 3.3 (*)    Glucose, Bld 106 (*)    All other components within normal limits  SARS CORONAVIRUS 2 BY RT PCR (HOSPITAL ORDER, PERFORMED IN [facility])  CBC  BRAIN NATRIURETIC PEPTIDE  HEPARIN LEVEL (UNFRACTIONATED)  TROPONIN I (HIGH SENSITIVITY)    EKG EKG Interpretation  Date/Time:  Thursday August 05 2020 15:05:33 EDT Ventricular Rate:  84 PR Interval:    QRS Duration: 127 QT Interval:  373 QTC Calculation: 441 R Axis:   -80 Text Interpretation: Sinus rhythm Nonspecific IVCD with LAD Left ventricular hypertrophy Anterior infarct, old Nonspecific T abnormalities, lateral leads No previous ECGs available Confirmed by 06-09-1968 [phone]) on 08/05/2020 3:08:06 PM   Radiology DG Chest 2 View  Result Date: 08/05/2020 CLINICAL DATA:  54 year old male with syncope several days ago, pain radiating to the left shoulder. EXAM: CHEST - 2 VIEW  COMPARISON:  Portable chest 06/27/2010. FINDINGS: PA and lateral views. Mild thoracolumbar scoliosis. Cardiac size and mediastinal contours are within normal limits. Visualized tracheal air column is within normal limits. Both lungs appear clear. No pneumothorax or pleural effusion. Degeneration in the spine. No acute osseous abnormality identified. Negative visible bowel gas pattern. IMPRESSION: 1. No cardiopulmonary abnormality. 2. Scoliosis and spine degeneration. Electronically Signed   By: 06/29/2010  M.D.   On: 08/05/2020 15:18    Procedures .Critical Care Performed by: Virgina Norfolk, DO Authorized by: Virgina Norfolk, DO   Critical care provider statement:    Critical care time (minutes):  45   Critical care was necessary to treat or prevent imminent or life-threatening deterioration of the following conditions:  Cardiac failure   Critical care was time spent personally by me on the following activities:  Blood draw for specimens, development of treatment plan with patient or surrogate, discussions with primary provider, evaluation of patient's response to treatment, examination of patient, ordering and performing treatments and interventions, ordering and review of laboratory studies, pulse oximetry, ordering and review of radiographic studies, re-evaluation of patient's condition, obtaining history from patient or surrogate and review of old charts   I assumed direction of critical care for this patient from another provider in my specialty: no     (including critical care time)  Medications Ordered in ED Medications  heparin bolus via infusion 4,000 Units (has no administration in time range)  heparin ADULT infusion 100 units/mL (25000 units/279mL sodium chloride 0.45%) (has no administration in time range)  aspirin chewable tablet 324 mg (324 mg Oral Given 08/05/20 1533)    ED Course  I have reviewed the triage vital signs and the nursing notes.  Pertinent labs & imaging results that  were available during my care of the patient were reviewed by me and considered in my medical decision making (see chart for details).    MDM Rules/Calculators/A&P                          Calvin Rodriguez is a 54 year old male with history of ischemic cardiomyopathy, CAD presents to the ED after having elevated troponin.  Patient with unremarkable vitals.  No fever.  Had 2 unprovoked syncopal events on Monday.  Went to see his cardiologist today who ordered a troponin in the setting of syncope.  Troponin was elevated at 2700.  According to note he did have a bedside echo that showed a decreased EF to 20 to 25%.  EKG here shows sinus rhythm.  There is some mild ST changes in the anterior leads with some elevation.  Talked with Dr. Jerene Pitch with cardiology and does not believe this looks like a STEMI.  However there are some Q waves anteriorly which suspect that he likely had a significant cardiac event recently. Patient is not having any chest pain is overall asymptomatic.  Has not had any other syncopal events.  Lab work otherwise shows no significant anemia, electrolyte abnormality, kidney injury.  Chest x-ray shows no edema, no infection.  We will get Covid test.  Will start heparin bolus and infusion for possible NSTEMI per cardiology.  Will likely get a heart cath and patient.  Currently stable at this time and will be admitted to the floor at [facility].  Hemodynamically stable throughout my care.  This chart was dictated using voice recognition software.  Despite best efforts to proofread,  errors can occur which can change the documentation meaning.    Final Clinical Impression(s) / ED Diagnoses Final diagnoses:  NSTEMI (non-ST elevated myocardial infarction) (HCC)  Syncope, unspecified syncope type    Rx / DC Orders ED Discharge Orders    None       Virgina Norfolk, DO 08/05/20 1536

## 2020-08-05 NOTE — Addendum Note (Signed)
Addended by: Lita Mains on: 08/05/2020 09:18 AM   Modules accepted: Orders

## 2020-08-05 NOTE — Patient Instructions (Signed)
Medication Instructions:  Your physician recommends that you continue on your current medications as directed. Please refer to the Current Medication list given to you today.  *If you need a refill on your cardiac medications before your next appointment, please call your pharmacy*   Lab Work: Your physician recommends that you return for lab work in today: bmp, mg, troponin  If you have labs (blood work) drawn today and your tests are completely normal, you will receive your results only by: Marland Kitchen MyChart Message (if you have MyChart) OR . A paper copy in the mail If you have any lab test that is abnormal or we need to change your treatment, we will call you to review the results.   Testing/Procedures: A zio monitor was ordered today. It will remain on for 7 days. You will then return monitor and event diary in provided box. It takes 1-2 weeks for report to be downloaded and returned to [place]. We will call you with the results. If monitor falls off or has orange flashing light, please call Zio for further instructions.      Follow-Up: At [facility], you and your health needs are our priority.  As part of our continuing mission to provide you with exceptional heart care, we have created designated Provider Care Teams.  These Care Teams include your primary Cardiologist (physician) and Advanced Practice Providers (APPs -  Physician Assistants and Nurse Practitioners) who all work together to provide you with the care you need, when you need it.  We recommend signing up for the patient portal called "MyChart".  Sign up information is provided on this After Visit Summary.  MyChart is used to connect with patients for Virtual Visits (Telemedicine).  Patients are able to view lab/test results, encounter notes, upcoming appointments, etc.  Non-urgent messages can be sent to your provider as well.   To learn more about what you can do with MyChart, go to [email or web address].    Your next  appointment:   3 week(s)  The format for your next appointment:   In Person  Provider:   Gypsy Balsam, MD   Other Instructions

## 2020-08-05 NOTE — ED Triage Notes (Signed)
Sent from cardiologist upstairs with trp of >2700. Reports syncope several days ago and some L shoulder pain. Denies chest pain.

## 2020-08-05 NOTE — Progress Notes (Signed)
ANTICOAGULATION CONSULT NOTE - Initial Consult  Pharmacy Consult for heparin Indication: chest pain/ACS  No Known Allergies  Patient Measurements: Height: 5\' 9"  (175.3 cm) Weight: 83 kg (183 lb) IBW/kg (Calculated) : 70.7 Heparin Dosing Weight:  83 kg  Vital Signs: Temp: 99.2 F (37.3 C) (08/26 1445) Temp Source: Oral (08/26 1445) BP: 118/86 (08/26 1445) Pulse Rate: 97 (08/26 1445)  Labs: Recent Labs    08/05/20 0914 08/05/20 1458  HGB  --  14.9  HCT  --  44.2  PLT  --  324  CREATININE 0.82  --     Estimated Creatinine Clearance: 104.2 mL/min (by C-G formula based on SCr of 0.82 mg/dL).   Medical History: Past Medical History:  Diagnosis Date  . Heart attack (HCC) 12/1998    Assessment: Syncope with L shoulder pain; TRP > 2700  Goal of Therapy:  Heparin level 0.3-0.7 units/ml Monitor platelets by anticoagulation protocol: Yes   Plan:  Give 4000 units bolus x 1 Start heparin infusion at 1000 units/hr Check anti-Xa level in 6 hours and daily while on heparin Continue to monitor H&H and platelets  01/1999 A Christophr Calix 08/05/2020,3:32 PM

## 2020-08-05 NOTE — Progress Notes (Signed)
ANTICOAGULATION CONSULT NOTE   Pharmacy Consult for Heparin Indication: chest pain/ACS  No Known Allergies  Patient Measurements: Height: 5\' 9"  (175.3 cm) Weight: 80.7 kg (178 lb) IBW/kg (Calculated) : 70.7 Heparin Dosing Weight:  83 kg  Vital Signs: Temp: 98.4 F (36.9 C) (08/26 2114) Temp Source: Oral (08/26 2114) BP: 109/72 (08/26 2114) Pulse Rate: 72 (08/26 2114)  Labs: Recent Labs    08/05/20 0914 08/05/20 1458 08/05/20 1701 08/05/20 2237  HGB  --  14.9  --   --   HCT  --  44.2  --   --   PLT  --  324  --   --   HEPARINUNFRC  --   --   --  0.11*  CREATININE 0.82 0.89  --   --   TROPONINIHS  --  9,07608/28/21*  --     Estimated Creatinine Clearance: 96 mL/min (by C-G formula based on SCr of 0.89 mg/dL).   Medical History: Past Medical History:  Diagnosis Date  . Heart attack (HCC) 12/1998    Assessment: Syncope with L shoulder pain, troponin elevated, on heparin  Heparin level low No issues per RN  Goal of Therapy:  Heparin level 0.3-0.7 units/ml Monitor platelets by anticoagulation protocol: Yes   Plan:  Heparin 2000 units [employer] heparin to 1200 units/hr Re-check heparin level in 6-8 hours  01/1999, PharmD, BCPS Clinical Pharmacist Phone: [phone]

## 2020-08-06 ENCOUNTER — Inpatient Hospital Stay (HOSPITAL_COMMUNITY): Payer: Commercial Managed Care - PPO

## 2020-08-06 ENCOUNTER — Encounter (HOSPITAL_COMMUNITY)
Admission: EM | Disposition: A | Discharge status: Home / Self Care | Payer: Self-pay | Source: Ambulatory Visit | Attending: Cardiology

## 2020-08-06 ENCOUNTER — Other Ambulatory Visit: Payer: Self-pay | Admitting: *Deleted

## 2020-08-06 ENCOUNTER — Other Ambulatory Visit (HOSPITAL_COMMUNITY): Payer: Commercial Managed Care - PPO

## 2020-08-06 ENCOUNTER — Telehealth: Payer: Self-pay | Admitting: Cardiology

## 2020-08-06 ENCOUNTER — Encounter (HOSPITAL_COMMUNITY): Payer: Self-pay | Admitting: Cardiology

## 2020-08-06 ENCOUNTER — Other Ambulatory Visit: Payer: Self-pay | Admitting: Cardiothoracic Surgery

## 2020-08-06 DIAGNOSIS — I2510 Atherosclerotic heart disease of native coronary artery without angina pectoris: Secondary | ICD-10-CM

## 2020-08-06 DIAGNOSIS — I25110 Atherosclerotic heart disease of native coronary artery with unstable angina pectoris: Secondary | ICD-10-CM

## 2020-08-06 DIAGNOSIS — I5041 Acute combined systolic (congestive) and diastolic (congestive) heart failure: Secondary | ICD-10-CM

## 2020-08-06 DIAGNOSIS — I255 Ischemic cardiomyopathy: Secondary | ICD-10-CM

## 2020-08-06 DIAGNOSIS — I25118 Atherosclerotic heart disease of native coronary artery with other forms of angina pectoris: Secondary | ICD-10-CM

## 2020-08-06 DIAGNOSIS — I509 Heart failure, unspecified: Secondary | ICD-10-CM

## 2020-08-06 DIAGNOSIS — I340 Nonrheumatic mitral (valve) insufficiency: Secondary | ICD-10-CM

## 2020-08-06 DIAGNOSIS — E785 Hyperlipidemia, unspecified: Secondary | ICD-10-CM

## 2020-08-06 DIAGNOSIS — Z01810 Encounter for preprocedural cardiovascular examination: Secondary | ICD-10-CM

## 2020-08-06 DIAGNOSIS — R55 Syncope and collapse: Secondary | ICD-10-CM

## 2020-08-06 HISTORY — PX: LEFT HEART CATH AND CORONARY ANGIOGRAPHY: CATH118249

## 2020-08-06 LAB — HEPARIN LEVEL (UNFRACTIONATED): Heparin Unfractionated: 0.18 IU/mL — ABNORMAL LOW (ref 0.30–0.70)

## 2020-08-06 LAB — LIPID PANEL
Cholesterol: 166 mg/dL (ref 0–200)
HDL: 34 mg/dL — ABNORMAL LOW (ref >40)
LDL Cholesterol: 115 mg/dL — ABNORMAL HIGH (ref 0–99)
Total CHOL/HDL Ratio: 4.9 RATIO
Triglycerides: 87 mg/dL (ref <150)
VLDL: 17 mg/dL (ref 0–40)

## 2020-08-06 LAB — ECHOCARDIOGRAM COMPLETE
Area-P 1/2: 6.17 cm2
Calc EF: 30.2 %
Height: 69 in
S' Lateral: 5.29 cm
Single Plane A2C EF: 27.1 %
Single Plane A4C EF: 33.5 %
Weight: 2856 oz

## 2020-08-06 LAB — URINALYSIS, ROUTINE W REFLEX MICROSCOPIC
Bilirubin Urine: NEGATIVE
Glucose, UA: NEGATIVE mg/dL
Ketones, ur: NEGATIVE mg/dL
Leukocytes,Ua: NEGATIVE
Nitrite: NEGATIVE
Protein, ur: NEGATIVE mg/dL
RBC / HPF: >50 RBC/hpf — ABNORMAL HIGH (ref 0–5)
Specific Gravity, Urine: 1.028 (ref 1.005–1.030)
pH: 6 (ref 5.0–8.0)

## 2020-08-06 LAB — COMPREHENSIVE METABOLIC PANEL
ALT: 27 U/L (ref 0–44)
AST: 36 U/L (ref 15–41)
Albumin: 3.3 g/dL — ABNORMAL LOW (ref 3.5–5.0)
Alkaline Phosphatase: 55 U/L (ref 38–126)
Anion gap: 9 (ref 5–15)
BUN: 13 mg/dL (ref 6–20)
CO2: 25 mmol/L (ref 22–32)
Calcium: 8.6 mg/dL — ABNORMAL LOW (ref 8.9–10.3)
Chloride: 107 mmol/L (ref 98–111)
Creatinine, Ser: 1 mg/dL (ref 0.61–1.24)
GFR calc Af Amer: >60 mL/min (ref >60)
GFR calc non Af Amer: >60 mL/min (ref >60)
Glucose, Bld: 93 mg/dL (ref 70–99)
Potassium: 4 mmol/L (ref 3.5–5.1)
Sodium: 141 mmol/L (ref 135–145)
Total Bilirubin: 1 mg/dL (ref 0.3–1.2)
Total Protein: 6.1 g/dL — ABNORMAL LOW (ref 6.5–8.1)

## 2020-08-06 LAB — SURGICAL PCR SCREEN
MRSA, PCR: NEGATIVE
Staphylococcus aureus: NEGATIVE

## 2020-08-06 LAB — HIV ANTIBODY (ROUTINE TESTING W REFLEX): HIV Screen 4th Generation wRfx: NONREACTIVE

## 2020-08-06 SURGERY — LEFT HEART CATH AND CORONARY ANGIOGRAPHY
Anesthesia: LOCAL

## 2020-08-06 MED ORDER — IOHEXOL 350 MG/ML SOLN
INTRAVENOUS | Status: DC | PRN
  Administered 2020-08-06: 70 mL via INTRA_ARTERIAL

## 2020-08-06 MED ORDER — LIDOCAINE HCL (PF) 1 % IJ SOLN
INTRAMUSCULAR | Status: AC
  Filled 2020-08-06: qty 30

## 2020-08-06 MED ORDER — SODIUM CHLORIDE 0.9 % WEIGHT BASED INFUSION: 1.0000 mL/kg/h | INTRAVENOUS | Status: AC

## 2020-08-06 MED ORDER — LIDOCAINE HCL (PF) 1 % IJ SOLN
INTRAMUSCULAR | Status: DC | PRN
  Administered 2020-08-06: 2 mL

## 2020-08-06 MED ORDER — FENTANYL CITRATE (PF) 100 MCG/2ML IJ SOLN
INTRAMUSCULAR | Status: AC
  Filled 2020-08-06: qty 2

## 2020-08-06 MED ORDER — VERAPAMIL HCL 2.5 MG/ML IV SOLN
INTRAVENOUS | Status: DC | PRN
  Administered 2020-08-06: 10 mL via INTRA_ARTERIAL

## 2020-08-06 MED ORDER — HEPARIN SODIUM (PORCINE) 1000 UNIT/ML IJ SOLN
INTRAMUSCULAR | Status: DC | PRN
  Administered 2020-08-06: 4000 [IU] via INTRAVENOUS

## 2020-08-06 MED ORDER — MIDAZOLAM HCL 2 MG/2ML IJ SOLN
INTRAMUSCULAR | Status: DC | PRN
  Administered 2020-08-06: 1 mg via INTRAVENOUS

## 2020-08-06 MED ORDER — HEPARIN (PORCINE) IN NACL 1000-0.9 UT/500ML-% IV SOLN
INTRAVENOUS | Status: AC
  Filled 2020-08-06: qty 1000

## 2020-08-06 MED ORDER — SODIUM CHLORIDE 0.9% FLUSH
3.0000 mL | Freq: Two times a day (BID) | INTRAVENOUS | Status: DC
  Administered 2020-08-08 (×2): 3 mL via INTRAVENOUS

## 2020-08-06 MED ORDER — SODIUM CHLORIDE 0.9% FLUSH: 3.0000 mL | INTRAVENOUS | Status: DC | PRN

## 2020-08-06 MED ORDER — VERAPAMIL HCL 2.5 MG/ML IV SOLN
INTRAVENOUS | Status: AC
  Filled 2020-08-06: qty 2

## 2020-08-06 MED ORDER — MOMETASONE FURO-FORMOTEROL FUM 200-5 MCG/ACT IN AERO
2.0000 | INHALATION_SPRAY | Freq: Two times a day (BID) | RESPIRATORY_TRACT | Status: DC
  Filled 2020-08-06: qty 8.8

## 2020-08-06 MED ORDER — HEPARIN (PORCINE) 25000 UT/250ML-% IV SOLN
1600.0000 [IU]/h | INTRAVENOUS | Status: DC
  Administered 2020-08-06: 1200 [IU]/h via INTRAVENOUS
  Administered 2020-08-07: 1400 [IU]/h via INTRAVENOUS
  Administered 2020-08-08: 1500 [IU]/h via INTRAVENOUS
  Administered 2020-08-09: 1600 [IU]/h via INTRAVENOUS
  Filled 2020-08-06 (×3): qty 250

## 2020-08-06 MED ORDER — FENTANYL CITRATE (PF) 100 MCG/2ML IJ SOLN
INTRAMUSCULAR | Status: DC | PRN
Start: 2020-08-06 — End: 2020-08-06
  Administered 2020-08-06: 25 ug via INTRAVENOUS

## 2020-08-06 MED ORDER — SODIUM CHLORIDE 0.9 % IV SOLN: 250.0000 mL | INTRAVENOUS | Status: DC | PRN

## 2020-08-06 MED ORDER — HEPARIN SODIUM (PORCINE) 1000 UNIT/ML IJ SOLN
INTRAMUSCULAR | Status: AC
  Filled 2020-08-06: qty 1

## 2020-08-06 MED ORDER — MIDAZOLAM HCL 2 MG/2ML IJ SOLN
INTRAMUSCULAR | Status: AC
  Filled 2020-08-06: qty 2

## 2020-08-06 MED ORDER — HEPARIN (PORCINE) IN NACL 1000-0.9 UT/500ML-% IV SOLN
INTRAVENOUS | Status: DC | PRN
  Administered 2020-08-06 (×2): 500 mL

## 2020-08-06 MED ORDER — DAPAGLIFLOZIN PROPANEDIOL 5 MG PO TABS
5.0000 mg | ORAL_TABLET | Freq: Every day | ORAL | Status: DC
  Filled 2020-08-06 (×4): qty 1

## 2020-08-06 MED ORDER — PERFLUTREN LIPID MICROSPHERE
1.0000 mL | INTRAVENOUS | Status: AC | PRN
  Administered 2020-08-06: 2 mL via INTRAVENOUS
  Filled 2020-08-06: qty 10

## 2020-08-06 SURGICAL SUPPLY — 10 items
CATH 5FR JL3.5 JR4 ANG PIG MP (CATHETERS) ×1 IMPLANT
DEVICE RAD COMP TR BAND LRG (VASCULAR PRODUCTS) ×1 IMPLANT
GLIDESHEATH SLEND SS 6F .021 (SHEATH) ×1 IMPLANT
GUIDEWIRE INQWIRE 1.5J.035X260 (WIRE) IMPLANT
INQWIRE 1.5J .035X260CM (WIRE) ×2
KIT HEART LEFT (KITS) ×2 IMPLANT
PACK CARDIAC CATHETERIZATION (CUSTOM PROCEDURE TRAY) ×2 IMPLANT
SYR MEDRAD MARK 7 150ML (SYRINGE) ×2 IMPLANT
TRANSDUCER W/STOPCOCK (MISCELLANEOUS) ×2 IMPLANT
TUBING CIL FLEX 10 FLL-RA (TUBING) ×2 IMPLANT

## 2020-08-06 NOTE — Progress Notes (Signed)
ANTICOAGULATION CONSULT NOTE   Pharmacy Consult for Heparin Indication: chest pain/ACS  No Known Allergies  Patient Measurements: Height: 5\' 9"  (175.3 cm) Weight: 81 kg (178 lb 8 oz) IBW/kg (Calculated) : 70.7 Heparin Dosing Weight:  83 kg  Vital Signs: Temp: 97.7 F (36.5 C) (08/27 0829) Temp Source: Oral (08/27 0829) BP: 102/67 (08/27 0829) Pulse Rate: 79 (08/27 0829)  Labs: Recent Labs    08/05/20 0914 08/05/20 1458 08/05/20 1701 08/05/20 2237 08/06/20 0639  HGB  --  14.9  --   --   --   HCT  --  44.2  --   --   --   PLT  --  324  --   --   --   HEPARINUNFRC  --   --   --  0.11* 0.18*  CREATININE 0.82 0.89  --   --  1.00  TROPONINIHS  --  9,07608/29/21*  --   --     Estimated Creatinine Clearance: 85.4 mL/min (by C-G formula based on SCr of 1 mg/dL).   Medical History: Past Medical History:  Diagnosis Date  . Heart attack (HCC) 12/1998    Assessment: 16 yoM admitted with NSTEMI started on IV heparin. Pt s/p LHC with multivessel CAD, CVTS consulted for possible surgical revascularization.  Pharmacy to resume heparin in 8hr from sheath removal (~0800).  Goal of Therapy:  Heparin level 0.3-0.7 units/ml Monitor platelets by anticoagulation protocol: Yes   Plan:  Restart heparin 1200 units/h no bolus at 1600 Check 6hr heparin level   04-23-1969, PharmD, BCPS Clinical Pharmacist [phone] Please check AMION for all [facility] numbers 08/06/2020

## 2020-08-06 NOTE — Progress Notes (Signed)
Patient called wife and sister

## 2020-08-06 NOTE — Progress Notes (Addendum)
Progress Note  Patient Name: Calvin Rodriguez Date of Encounter: 08/06/2020  [facility] Cardiologist:  Branch  Subjective   No chest pain. Lying flat with no dyspnea.  Inpatient Medications    Scheduled Meds: . [START ON 08/07/2020] aspirin EC  81 mg Oral Daily  . atorvastatin  80 mg Oral Daily  . carvedilol  3.125 mg Oral BID WC  . sodium chloride flush  3 mL Intravenous Q12H  . sodium chloride flush  3 mL Intravenous Q12H   Continuous Infusions: . sodium chloride    . sodium chloride    . sodium chloride    . heparin     PRN Meds: sodium chloride, sodium chloride, acetaminophen, ALPRAZolam, nitroGLYCERIN, ondansetron (ZOFRAN) IV, sodium chloride flush, sodium chloride flush, zolpidem   Vital Signs    Vitals:   08/06/20 0414 08/06/20 0603 08/06/20 0734 08/06/20 0829  BP: 110/79   102/67  Pulse: 72   79  Resp:      Temp: 98.1 F (36.7 C)   97.7 F (36.5 C)  TempSrc: Oral   Oral  SpO2:   98% 96%  Weight:  81 kg    Height:       No intake or output data in the 24 hours ending 08/06/20 1012 Last 3 Weights 08/06/2020 08/05/2020 08/05/2020  Weight (lbs) 178 lb 8 oz 178 lb 183 lb  Weight (kg) 80.967 kg 80.74 kg 83.008 kg      Telemetry    NSR - Personally Reviewed  ECG    Performed on 08/06/2020 at 5:53 AM demonstrates poor R wave progression V1 through V4, interventricular conduction delay, left axis deviation.  Possible prior anterior myocardial infarction.  Compared to July 2021, 1 and aVL ST elevation has resolved.- Personally Reviewed  Physical Exam   GEN: No acute distress.   Neck: No JVD Cardiac: RRR, no murmurs, rubs, or gallops.  Right radial cath site is unremarkable. Respiratory: Clear to auscultation bilaterally. GI: Soft, nontender, non-distended  MS: No edema; No deformity. Neuro:  Nonfocal  Psych: Normal affect   Labs    High Sensitivity Troponin:   Recent Labs  Lab 08/05/20 1458 08/05/20 1701  TROPONINIHS 9,076* 9,897*        Chemistry Recent Labs  Lab 08/05/20 0914 08/05/20 1458 08/06/20 0639  NA 141 140 141  K 4.0 3.3* 4.0  CL 102 103 107  CO2 30* 25 25  GLUCOSE 107* 106* 93  BUN 13 13 13   CREATININE 0.82 0.89 1.00  CALCIUM 9.7 8.9 8.6*  PROT  --   --  6.1*  ALBUMIN  --   --  3.3*  AST  --   --  36  ALT  --   --  27  ALKPHOS  --   --  55  BILITOT  --   --  1.0  GFRNONAA 101 >60 >60  GFRAA 117 >60 >60  ANIONGAP  --  12 9     Hematology Recent Labs  Lab 08/05/20 1458  WBC 9.6  RBC 4.71  HGB 14.9  HCT 44.2  MCV 93.8  MCH 31.6  MCHC 33.7  RDW 13.4  PLT 324    BNP Recent Labs  Lab 08/05/20 1458  BNP 422.9*     DDimer No results for input(s): DDIMER in the last 168 hours.   Radiology    DG Chest 2 View  Result Date: 08/05/2020 CLINICAL DATA:  54 year old male with syncope several days ago, pain radiating  to the left shoulder. EXAM: CHEST - 2 VIEW COMPARISON:  Portable chest 06/27/2010. FINDINGS: PA and lateral views. Mild thoracolumbar scoliosis. Cardiac size and mediastinal contours are within normal limits. Visualized tracheal air column is within normal limits. Both lungs appear clear. No pneumothorax or pleural effusion. Degeneration in the spine. No acute osseous abnormality identified. Negative visible bowel gas pattern. IMPRESSION: 1. No cardiopulmonary abnormality. 2. Scoliosis and spine degeneration. Electronically Signed   By: Odessa Fleming M.D.   On: 08/05/2020 15:18   CARDIAC CATHETERIZATION  Result Date: 08/06/2020  Mid LM lesion is 35% stenosed.  Prox LAD to Mid LAD lesion is 80% stenosed.  1st Diag lesion is 90% stenosed.  Ramus lesion is 60% stenosed.  Prox Cx to Mid Cx lesion is 80% stenosed.  Prox RCA to Mid RCA lesion is 100% stenosed.  There is severe left ventricular systolic dysfunction.  LV end diastolic pressure is mildly elevated.  The left ventricular ejection fraction is less than 25% by visual estimate.  1. Severe 3 vessel obstructive CAD 2. Severe LV  dysfunction. EF estimated at 20-25% 3. Mildly elevated LVEDP Plan: recommend CT surgery consult for CABG.   Cardiac Studies   Cardiac catheterization with coronary angiography 08/06/2020: Diagnostic Dominance: Right  Severe LV systolic dysfunction with EF 25%, Suspect component of hybernation.  Patient Profile     54 y.o. male with history of CAD and chronic sysotlic HF presents with syncope, significant elevated troponin from outpatient cardioogy clinic visit.  Cardiac catheterization demonstrates severe three-vessel disease.  This was complicated by severe left ventricular systolic dysfunction with EF less than 30%.  Assessment & Plan    1. Severe multivessel coronary artery disease as outlined above: Surgical consult has been requested. 2. Severe left ventricular systolic dysfunction: Hopefully there is significant hibernating myocardium.  Cardiac MRI should be performed to verify.  Guideline directed therapy indicated.  We will hold on renin-angiotensin system blockade until decision concerning surgery is made.  Would not want to negatively influence kidney function.  Consider adding SGLT2, dapagliflozin. 3. Hyperlipidemia: Aggressive high intensity statin therapy indicated. 4. Prediabetes: Consider dapagliflozin. 5. Recurrent syncope: Etiology is uncertain.  Rule out ischemic arrhythmia, bradycardia arrhythmia, low cardiac output.  For questions or updates, please contact [facility] Please consult [email or web address] for contact info under        Signed, Lesleigh Noe, MD  08/06/2020, 10:12 AM

## 2020-08-06 NOTE — Progress Notes (Signed)
TCTS consulted for CABG evaluation. °

## 2020-08-06 NOTE — Telephone Encounter (Signed)
Adela Lank, patient's sister, is requesting to speak with Dr. Stephens Shire or have Dr. Bing Matter speak with her brother in regards to him possibly having to have open heart surgery. She states Mr. Banwart is very nervous and on the fence about open heart surgery and feels like Dr. Bing Matter could provide some insight or his recommendation. Please advise. She is aware Dr. Bing Matter is off today and will be back next week.

## 2020-08-06 NOTE — Telephone Encounter (Signed)
Patient was seen in office yesterday 

## 2020-08-06 NOTE — Consult Note (Signed)
[address]       [place] [address]             [phone]        [facility] Medical Record #[id number] Date of Birth: 12-21-1965  Referring:P Swaziland, MD Primary Care: Calvin Proffer, MD Primary Cardiologist:Krasowski Chief Complaint:    Chief Complaint  Patient presents with  . Loss of Consciousness  Patient examined, images of coronary angiogram and echocardiogram and chest x-ray all personally reviewed and counseled with patient  History of Present Illness:     54 year old smoker with positive family history of CAD presents with recent diagnosis of severe three-vessel coronary artery disease with ejection fraction of 25%.  Calvin Rodriguez years ago the patient had a anterior MI with PCI of the LAD at [facility].  He has had no further studies until recently when he had 2 episodes of syncope in early morning 2 days ago.  He was admitted to the hospital after troponins were found to be positive.  His syncope was not with exertion and there is no evidence of seizure.  No previous history of TIA dizziness or presyncope.  Echocardiogram demonstrated EF of 25%.  Today he underwent cardiac catheterization which demonstrates severe three-vessel coronary disease with chronic occlusion of the RCA, 80% stenosis of the proximal circumflex and 80% stenosis of the proximal LAD stent.  EF is 25%.  LVEDP not reported.  With the syncopal episodes the patient had no symptoms of chest pain, orthopnea, PND, nominal or ankle swelling.  No palpitations or arrhythmias by EKG.  Because of his severe two-vessel coronary disease and reduced LV function he is recommended for multivessel CABG.  The LAD, ramus and circumflex appear to be adequate targets and the collateralized distal RCA also is a dental target for grafting.  Patient is pending his carotid Dopplers, cardiac MRI viability study, and PFTs.  He is an active smoker 1 pack/day.  No diabetes  history.   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  He is married with a 65 year old son   Zubrod Score: At the time of surgery this patient's most appropriate activity status/level should be described as: []     0    Normal activity, no symptoms []     1    Restricted in physical strenuous activity but ambulatory, able to do out light work [x]     2    Ambulatory and capable of self care, unable to do work activities, up and about                 more than 50%  Of the time                            []     3    Only limited self care, in bed greater than 50% of waking hours []     4    Completely disabled, no self care, confined to bed or chair []     5    Moribund  Past Medical History:  Diagnosis Date  . Coronary artery disease   . GERD (gastroesophageal reflux disease)   . Heart attack (HCC) 12/1998    Past Surgical History:  Procedure Laterality Date  . CORONARY ANGIOPLASTY     2000 stent   . LEFT HEART CATH AND CORONARY ANGIOGRAPHY  N/A 08/06/2020   Procedure: LEFT HEART CATH AND CORONARY ANGIOGRAPHY;  Surgeon: Swaziland, Antron Seth M, MD;  Location: [facility] INVASIVE CV LAB;  Service: Cardiovascular;  Laterality: N/A;  . STENT PLACEMENT VASCULAR ([facility] HX)  12/1998  . TONSILLECTOMY      Social History   Tobacco Use  Smoking Status Current Every Day Smoker  . Packs/day: 0.75  . Types: Cigarettes  Smokeless Tobacco Never Used    Social History   Substance and Sexual Activity  Alcohol Use Never     Allergies  Allergen Reactions  . Entresto [Sacubitril-Valsartan] Other (See Comments)    Severe weakness, fatigue, ? Contributed to syncopal episode    Current Facility-Administered Medications  Medication Dose Route Frequency Provider Last Rate Last Admin  . 0.9 %  sodium chloride infusion  250 mL Intravenous PRN [place], Atalya Dano M, MD      . 0.9 %  sodium chloride infusion  250 mL Intravenous PRN [place],  Evangelos Paulino M, MD      . acetaminophen (TYLENOL) tablet 650 mg  650 mg Oral Q4H PRN [place], Larnie Heart M, MD      . ALPRAZolam Prudy Feeler) tablet 0.25 mg  0.25 mg Oral BID PRN [place], Jamario Colina M, MD      . Melene Muller ON 08/07/2020] aspirin EC tablet 81 mg  81 mg Oral Daily [place], Bryndan Bilyk M, MD      . atorvastatin (LIPITOR) tablet 80 mg  80 mg Oral Daily [place], Evy Lutterman M, MD   80 mg at 08/06/20 1031  . carvedilol (COREG) tablet 3.125 mg  3.125 mg Oral BID WC [place], Carlen Fils M, MD      . dapagliflozin propanediol Marcelline Deist) tablet 5 mg  5 mg Oral Daily Lyn Records, MD      . heparin ADULT infusion 100 units/mL (25000 units/27mL sodium chloride 0.45%)  1,200 Units/hr Intravenous Continuous Mosetta Anis, RPH      . mometasone-formoterol (DULERA) 200-5 MCG/ACT inhaler 2 puff  2 puff Inhalation BID Donata Clay, Theron Arista, MD      . nitroGLYCERIN (NITROSTAT) SL tablet 0.4 mg  0.4 mg Sublingual Q5 Min x 3 PRN [place], Diallo Ponder M, MD      . ondansetron [facility]) injection 4 mg  4 mg Intravenous Q6H PRN [place], Anjelina Dung M, MD      . sodium chloride flush (NS) 0.9 % injection 3 mL  3 mL Intravenous Q12H [place], Darron Stuck M, MD      . sodium chloride flush (NS) 0.9 % injection 3 mL  3 mL Intravenous PRN [place], Demarco Bacci M, MD      . sodium chloride flush (NS) 0.9 % injection 3 mL  3 mL Intravenous Q12H [place], Rusti Arizmendi M, MD      . sodium chloride flush (NS) 0.9 % injection 3 mL  3 mL Intravenous PRN [place], Boni Maclellan M, MD      . zolpidem [facility]) tablet 5 mg  5 mg Oral QHS PRN,MR X 1 [place], Elaisha Zahniser M, MD        Medications Prior to Admission  Medication Sig Dispense Refill Last Dose  . acetaminophen (TYLENOL) 500 MG tablet Take 1,000 mg by mouth every 6 (six) hours as needed for mild pain.   Past Week at Unknown time  . aspirin EC 81 MG tablet Take 1 tablet (81 mg total) by mouth daily. Swallow whole. (Patient taking differently: Take 81 mg by mouth at bedtime. Swallow whole.) 90 tablet 3 08/04/2020  . Coenzyme Q10 (COQ-10 PO) Take 1 capsule by  mouth  daily.   Past Week at Unknown time  . GARLIC PO Take 1 tablet by mouth daily.   Past Week at Unknown time  . Multiple Vitamins-Minerals (MULTIVITAMIN WITH MINERALS) tablet Take 1 tablet by mouth daily.   08/05/2020 at Unknown time  . Omega-3 Fatty Acids (FISH OIL) 1000 MG CAPS Take 1,000 mg by mouth in the morning and at bedtime.   Past Week at Unknown time  . SOYA LECITHIN PO Take 1 capsule by mouth in the morning and at bedtime.   Past Week at Unknown time  . vitamin E (VITAMIN E) 180 MG (400 UNITS) capsule Take 400 Units by mouth daily.   Past Week at Unknown time  . rosuvastatin (CRESTOR) 40 MG tablet Take 40 mg by mouth daily. (Patient not taking: Reported on 08/06/2020)   Not Taking at Unknown time  . sacubitril-valsartan (ENTRESTO) 24-26 MG Take 1 tablet by mouth 2 (two) times daily. (Patient not taking: Reported on 08/06/2020)   Not Taking at Unknown time    Family History  Problem Relation Age of Onset  . Heart disease Mother   . Heart disease Father      Review of Systems:   ROS Right-hand-dominant, no thoracic trauma No history of blood transfusion No history of bleeding disorder     Cardiac Review of Systems: Y or  [    ]= no  Chest Pain [    ]  Resting SOB [   ] Exertional SOB  [  ]  Orthopnea [  ]   Pedal Edema [   ]    Palpitations [  ] Syncope  [  ]   Presyncope [   ]  General Review of Systems: [Y] = yes [  ]=no Constitional: recent weight change [intentional weight loss 15 pounds]; anorexia [  ]; fatigue [  ]; nausea [  ]; night sweats [  ]; fever [  ]; or chills [  ]                                                               Dental: Last Dentist visit: 1 year  Eye : blurred vision [  ]; diplopia [   ]; vision changes [  ];  Amaurosis fugax[  ]; Resp: cough [  ];  wheezing[  ];  hemoptysis[  ]; shortness of breath[  ]; paroxysmal nocturnal dyspnea[  ]; dyspnea on exertion[  ]; or orthopnea[  ];  GI:  gallstones[  ], vomiting[  ];  dysphagia[  ]; melena[  ];   hematochezia [  ]; heartburn[  ];   Hx of  Colonoscopy[  ]; GU: kidney stones [  ]; hematuria[  ];   dysuria [  ];  nocturia[  ];  history of     obstruction [  ]; urinary frequency [  ]             Skin: rash, swelling[  ];, hair loss[  ];  peripheral edema[  ];  or itching[  ]; Musculosketetal: myalgias[  ];  joint swelling[  ];  joint erythema[  ];  joint pain[  ];  back pain[  ];  Heme/Lymph: bruising[  ];  bleeding[  ];  anemia[  ];  Neuro:  Positive for syncope 2 days ago TIA[  ];  headaches[  ];  stroke[  ];  vertigo[  ];  seizures[  ];   paresthesias[  ];  difficulty walking[  ];  Psych:depression[  ]; anxiety[  ];  Endocrine: diabetes[  ];  thyroid dysfunction[  ];                Physical Exam: BP 102/67 (BP Location: Left Wrist)   Pulse 79   Temp 97.7 F (36.5 C) (Oral)   Resp 16   Ht 5\' 9"  (1.753 m)   Wt 81 kg   SpO2 96%   BMI 26.36 kg/m      Physical Exam  General:  HEENT: Normocephalic pupils equal , dentition adequate Neck: Supple without JVD, adenopathy, or bruit Chest: Clear to auscultation, symmetrical breath sounds, no rhonchi, no tenderness             or deformity Cardiovascular: Regular rate and rhythm, no murmur, no gallop, peripheral pulses             palpable in all extremities Abdomen:  Soft, nontender, no palpable mass or organomegaly Extremities: Warm, well-perfused, no clubbing cyanosis edema or tenderness,              no venous stasis changes of the legs Rectal/GU: Deferred Neuro: Grossly non--focal and symmetrical throughout Skin: Clean and dry without rash or ulceration   Diagnostic Studies & Laboratory data:     Recent Radiology Findings:   DG Chest 2 View  Result Date: 08/05/2020 CLINICAL DATA:  54 year old male with syncope several days ago, pain radiating to the left shoulder. EXAM: CHEST - 2 VIEW COMPARISON:  Portable chest 06/27/2010. FINDINGS: PA and lateral views. Mild thoracolumbar scoliosis. Cardiac size and mediastinal contours  are within normal limits. Visualized tracheal air column is within normal limits. Both lungs appear clear. No pneumothorax or pleural effusion. Degeneration in the spine. No acute osseous abnormality identified. Negative visible bowel gas pattern. IMPRESSION: 1. No cardiopulmonary abnormality. 2. Scoliosis and spine degeneration. Electronically Signed   By: Odessa FlemingH  Hall M.D.   On: 08/05/2020 15:18   CARDIAC CATHETERIZATION  Result Date: 08/06/2020  Mid LM lesion is 35% stenosed.  Prox LAD to Mid LAD lesion is 80% stenosed.  1st Diag lesion is 90% stenosed.  Ramus lesion is 60% stenosed.  Prox Cx to Mid Cx lesion is 80% stenosed.  Prox RCA to Mid RCA lesion is 100% stenosed.  There is severe left ventricular systolic dysfunction.  LV end diastolic pressure is mildly elevated.  The left ventricular ejection fraction is less than 25% by visual estimate.  1. Severe 3 vessel obstructive CAD 2. Severe LV dysfunction. EF estimated at Calvin Rodriguez-25% 3. Mildly elevated LVEDP Plan: recommend CT surgery consult for CABG.  ECHOCARDIOGRAM COMPLETE  Result Date: 08/06/2020    ECHOCARDIOGRAM REPORT   Patient Name:   Calvin Calvin Rodriguez Date of Exam: 08/06/2020 Medical Rec #:  [id number]              Height:       69.0 in Accession #:    [id number]             Weight:       178.5 lb Date of Birth:  05/06/1966               BSA:          1.969 m Patient Age:    [age] years  BP:           110/79 mmHg Patient Gender: M                      HR:           75 bpm. Exam Location:  Inpatient Procedure: 2D Echo, Cardiac Doppler and Color Doppler Indications:    NSTEMI I21.4  History:        Patient has prior history of Echocardiogram examinations, most                 recent 02/12/2020. Previous Myocardial Infarction and CAD; Risk                 Factors:Hypertension and Current Smoker.  Sonographer:    Eulah Pont RDCS Referring Phys: [id number] RHONDA G BARRETT IMPRESSIONS  1. Left ventricular ejection fraction, by estimation, is  25 to 30%. The left ventricle has severely decreased function. The left ventricle demonstrates global hypokinesis. Left ventricular diastolic parameters are consistent with Grade II diastolic dysfunction (pseudonormalization).  2. Right ventricular systolic function is normal. The right ventricular size is normal.  3. Left atrial size was mildly dilated.  4. The mitral valve is normal in structure. Mild mitral valve regurgitation. No evidence of mitral stenosis.  5. The aortic valve is normal in structure. Aortic valve regurgitation is not visualized. No aortic stenosis is present.  6. The inferior vena cava is normal in size with greater than 50% respiratory variability, suggesting right atrial pressure of 3 mmHg. FINDINGS  Left Ventricle: Left ventricular ejection fraction, by estimation, is 25 to 30%. The left ventricle has severely decreased function. The left ventricle demonstrates global hypokinesis. Definity contrast agent was given IV to delineate the left ventricular endocardial borders. The left ventricular internal cavity size was normal in size. There is no left ventricular hypertrophy. Left ventricular diastolic parameters are consistent with Grade II diastolic dysfunction (pseudonormalization).  LV Wall Scoring: The mid and distal anterior wall, mid and distal anterior septum, entire apex, mid and distal inferior wall, and mid inferoseptal segment are akinetic. Right Ventricle: The right ventricular size is normal. No increase in right ventricular wall thickness. Right ventricular systolic function is normal. Left Atrium: Left atrial size was mildly dilated. Right Atrium: Right atrial size was normal in size. Pericardium: There is no evidence of pericardial effusion. Mitral Valve: The mitral valve is normal in structure. Normal mobility of the mitral valve leaflets. Mild mitral valve regurgitation. No evidence of mitral valve stenosis. Tricuspid Valve: The tricuspid valve is normal in structure.  Tricuspid valve regurgitation is not demonstrated. No evidence of tricuspid stenosis. Aortic Valve: The aortic valve is normal in structure. Aortic valve regurgitation is not visualized. No aortic stenosis is present. Pulmonic Valve: The pulmonic valve was normal in structure. Pulmonic valve regurgitation is not visualized. No evidence of pulmonic stenosis. Aorta: The aortic root is normal in size and structure. Venous: The inferior vena cava is normal in size with greater than 50% respiratory variability, suggesting right atrial pressure of 3 mmHg. IAS/Shunts: No atrial level shunt detected by color flow Doppler.  LEFT VENTRICLE PLAX 2D LVIDd:         6.05 cm      Diastology LVIDs:         5.29 cm      LV e' lateral:   10.30 cm/s LV PW:         0.74 cm      LV E/e'  lateral: 9.0 LV IVS:        0.73 cm      LV e' medial:    4.31 cm/s LVOT diam:     2.10 cm      LV E/e' medial:  21.5 LV SV:         54 LV SV Index:   28 LVOT Area:     3.46 cm  LV Volumes (MOD) LV vol d, MOD A2C: 181.0 ml LV vol d, MOD A4C: 148.0 ml LV vol s, MOD A2C: 132.0 ml LV vol s, MOD A4C: 98.4 ml LV SV MOD A2C:     49.0 ml LV SV MOD A4C:     148.0 ml LV SV MOD BP:      50.0 ml RIGHT VENTRICLE RV S prime:     9.83 cm/s TAPSE (M-mode): 1.1 cm LEFT ATRIUM             Index       RIGHT ATRIUM           Index LA diam:        3.Calvin Rodriguez cm 1.63 cm/m  RA Area:     11.40 cm LA Vol (A2C):   53.4 ml 27.13 ml/m RA Volume:   23.40 ml  11.89 ml/m LA Vol (A4C):   78.6 ml 39.93 ml/m LA Biplane Vol: 67.0 ml 34.04 ml/m  AORTIC VALVE LVOT Vmax:   85.00 cm/s LVOT Vmean:  59.800 cm/s LVOT VTI:    0.157 m  AORTA Ao Root diam: 3.30 cm Ao Asc diam:  3.40 cm MITRAL VALVE MV Area (PHT): 6.17 cm    SHUNTS MV Decel Time: 123 msec    Systemic VTI:  0.16 m MV E velocity: 92.60 cm/s  Systemic Diam: 2.10 cm MV A velocity: 51.90 cm/s MV E/A ratio:  1.78 Donato Schultz MD Electronically signed by Donato Schultz MD Signature Date/Time: 08/06/2020/2:41:37 PM    Final      I have  independently reviewed the above radiologic studies and discussed with the patient   Recent Lab Findings: Lab Results  Component Value Date   WBC 9.6 08/05/2020   HGB 14.9 08/05/2020   HCT 44.2 08/05/2020   PLT 324 08/05/2020   GLUCOSE 93 08/06/2020   CHOL 166 08/06/2020   TRIG 87 08/06/2020   HDL 34 (L) 08/06/2020   LDLCALC 115 (H) 08/06/2020   ALT 27 08/06/2020   AST 36 08/06/2020   NA 141 08/06/2020   K 4.0 08/06/2020   CL 107 08/06/2020   CREATININE 1.00 08/06/2020   BUN 13 08/06/2020   CO2 25 08/06/2020   TSH 1.874 08/05/2020   HGBA1C 5.8 (H) 08/05/2020      Assessment / Plan:   Ischemic cardiomyopathy, graftable three-vessel coronary disease Syncope probably related to his low EF 25% and use of Entresto Previous anterior MI with PCI of the LAD 2001  Patient appears to be an appropriate candidate for multivessel bypass grafting although at increased risk due to his LV dysfunction.  Cardiac MRI viability study should help define the expected benefits of CABG.  The patient is agreeable to proceed with surgery but would very much like to be able to see his son Calvin Calvin Rodriguez with autism] before surgery. We will tentatively schedule the patient for Monday for multivessel CABG with backup cardiac mechanical support with Impella 5.5.  We will follow. @ 08/06/2020 4:15 PM

## 2020-08-06 NOTE — Progress Notes (Signed)
Pre-CABG testing has been completed. Preliminary results can be found in CV Proc through chart review.   08/06/20 4:18 PM Olen Cordial RVT

## 2020-08-06 NOTE — Telephone Encounter (Signed)
I spoke today with Renae Fickle, I explained to him the finding of cardiac catheterization, I told him that the best way to fix it is to do coronary artery bypass graft.  He understand that.  However, he wants to go home and spend some time with his son.  I told him that this is dangerous because I still strongly suspect  what happened Monday was syncope related to arrhythmia which could reoccur and he can have sudden cardiac death.  He does have severely diminished left ventricular ejection fraction with assessment of 20-25% on last echocardiogram, on top of that he did have recent coronary event as proven by abnormality of his troponins.  I explained all those risks to him and ask him to think hard about what he wants to do.  My recommendation was to proceed with bypass surgery as fast as possible. Please let me know if I can be of any help, you can call me anytime.  My cell phone number is [phone]

## 2020-08-06 NOTE — Interval H&P Note (Signed)
History and Physical Interval Note:  08/06/2020 7:29 AM  Brion Aliment IV Triggs  has presented today for surgery, with the diagnosis of nonstemi.  The various methods of treatment have been discussed with the patient and family. After consideration of risks, benefits and other options for treatment, the patient has consented to  Procedure(s): LEFT HEART CATH AND CORONARY ANGIOGRAPHY (N/A) as a surgical intervention.  The patient's history has been reviewed, patient examined, no change in status, stable for surgery.  I have reviewed the patient's chart and labs.  Questions were answered to the patient's satisfaction.   Cath Lab Visit (complete for each Cath Lab visit)  Clinical Evaluation Leading to the Procedure:   ACS: Yes.    Non-ACS:    Anginal Classification: CCS I  Anti-ischemic medical therapy: Minimal Therapy (1 class of medications)  Non-Invasive Test Results: No non-invasive testing performed  Prior CABG: No previous CABG        Theron Arista [facility] 08/06/2020 7:29 AM

## 2020-08-07 LAB — CBC
HCT: 43.5 % (ref 39.0–52.0)
Hemoglobin: 14.4 g/dL (ref 13.0–17.0)
MCH: 31.8 pg (ref 26.0–34.0)
MCHC: 33.1 g/dL (ref 30.0–36.0)
MCV: 96 fL (ref 80.0–100.0)
Platelets: 282 10*3/uL (ref 150–400)
RBC: 4.53 MIL/uL (ref 4.22–5.81)
RDW: 13.5 % (ref 11.5–15.5)
WBC: 8.9 10*3/uL (ref 4.0–10.5)
nRBC: 0 % (ref 0.0–0.2)

## 2020-08-07 LAB — BLOOD GAS, ARTERIAL
Acid-Base Excess: 2 mmol/L (ref 0.0–2.0)
Bicarbonate: 25.8 mmol/L (ref 20.0–28.0)
Drawn by: 51702
FIO2: 21
O2 Saturation: 95.3 %
Patient temperature: 37
pCO2 arterial: 37.9 mmHg (ref 32.0–48.0)
pH, Arterial: 7.447 (ref 7.350–7.450)
pO2, Arterial: 72.8 mmHg — ABNORMAL LOW (ref 83.0–108.0)

## 2020-08-07 LAB — PROTIME-INR
INR: 1 (ref 0.8–1.2)
Prothrombin Time: 12.7 seconds (ref 11.4–15.2)

## 2020-08-07 LAB — PREALBUMIN: Prealbumin: 15.7 mg/dL — ABNORMAL LOW (ref 18–38)

## 2020-08-07 LAB — BASIC METABOLIC PANEL
Anion gap: 8 (ref 5–15)
BUN: 11 mg/dL (ref 6–20)
CO2: 25 mmol/L (ref 22–32)
Calcium: 8.2 mg/dL — ABNORMAL LOW (ref 8.9–10.3)
Chloride: 106 mmol/L (ref 98–111)
Creatinine, Ser: 0.86 mg/dL (ref 0.61–1.24)
GFR calc Af Amer: >60 mL/min (ref >60)
GFR calc non Af Amer: >60 mL/min (ref >60)
Glucose, Bld: 99 mg/dL (ref 70–99)
Potassium: 3.7 mmol/L (ref 3.5–5.1)
Sodium: 139 mmol/L (ref 135–145)

## 2020-08-07 LAB — HEPARIN LEVEL (UNFRACTIONATED): Heparin Unfractionated: <0.1 IU/mL — ABNORMAL LOW (ref 0.30–0.70)

## 2020-08-07 MED ORDER — ALPRAZOLAM 0.5 MG PO TABS: 0.5000 mg | ORAL_TABLET | Freq: Three times a day (TID) | ORAL | Status: DC | PRN

## 2020-08-07 MED ORDER — NICOTINE 14 MG/24HR TD PT24
14.0000 mg | MEDICATED_PATCH | Freq: Every day | TRANSDERMAL | Status: DC
  Administered 2020-08-07 – 2020-08-13 (×6): 14 mg via TRANSDERMAL
  Filled 2020-08-07 (×6): qty 1

## 2020-08-07 MED ORDER — NICOTINE POLACRILEX 2 MG MT GUM
2.0000 mg | CHEWING_GUM | OROMUCOSAL | Status: DC | PRN
  Filled 2020-08-07: qty 1

## 2020-08-07 NOTE — Progress Notes (Signed)
CARDIAC REHAB PHASE I   [phone] - [phone]  Completed Pre op education with pt and gave him cardiac pre op booklet. Pt is very apprehensive and nervous about the surgery. He is worried about the financial implications of both the surgery and the time he will miss from work. Pt given incentive spirometer and was able to demonstrate proper usage.   Alisia Ferrari, MS, ACSM CEP 08/07/2020 10:01 AM

## 2020-08-07 NOTE — Progress Notes (Signed)
ANTICOAGULATION CONSULT NOTE   Pharmacy Consult for Heparin Indication: chest pain/ACS -  pending CABG  Allergies  Allergen Reactions  . Entresto [Sacubitril-Valsartan] Other (See Comments)    Severe weakness, fatigue, ? Contributed to syncopal episode    Patient Measurements: Height: 5\' 9"  (175.3 cm) Weight: 80.1 kg (176 lb 9.4 oz) IBW/kg (Calculated) : 70.7 Heparin Dosing Weight:  83 kg  Vital Signs: Temp: 98 F (36.7 C) (08/28 0500) Temp Source: Oral (08/28 0500) BP: 120/75 (08/28 0856) Pulse Rate: 72 (08/28 0856)  Labs: Recent Labs    08/05/20 1458 08/05/20 1701 08/05/20 2237 08/06/20 0639 08/06/20 2138 08/07/20 0219  HGB 14.9  --   --   --   --  14.4  HCT 44.2  --   --   --   --  43.5  PLT 324  --   --   --   --  282  LABPROT  --   --   --   --   --  12.7  INR  --   --   --   --   --  1.0  HEPARINUNFRC  --   --  0.11* 0.18* <0.10*  --   CREATININE 0.89  --   --  1.00  --  0.86  TROPONINIHS 9,076* 08/09/20*  --   --   --   --     Estimated Creatinine Clearance: 99.3 mL/min (by C-G formula based on SCr of 0.86 mg/dL).   Medical History: Past Medical History:  Diagnosis Date  . Coronary artery disease   . GERD (gastroesophageal reflux disease)   . Heart attack [facility]) 12/1998    Assessment: 54 yo male admitted with NSTEMI. The patient is tentatively planned for CABG procedure on 8/30 s/p LHC with multivessel CAD. Pharmacy is consulted to dose heparin. CBC is WNL and stable. There are no issues with the infusion or signs and symptoms of bleeding noted.   The patient was indicated to have a heparin level drawn on 8/28 at 1030 but the patient refused. The patient is anxious about surgery and stated that they will refuse all labs and injections today. Due to the patient's clinical status and need for surgery without other anticoagulation options, Dr. 9/28 and I discussed that the best course of action would be to continue the heparin infusion at the current rate  and ensure the patient gets labs drawn on 8/29 in the AM.   The patient's last heparin level on 8/27 was subtherapeutic at <0.10 after running at 1200 units/hour. The drip was subsequently increased to 1400 units/hour and has been running at that rate since. Will continue the heparin infusion at the current rate and follow up with AM heparin level.  Goal of Therapy:  Heparin level 0.3-0.7 units/ml Monitor platelets by anticoagulation protocol: Yes   Plan:  Continue heparin IV at 1400 units//hr Monitor daily heparin level and CBC, ensuring the patient gets AM labs drawn 8/29 Monitor for signs and symptoms of bleeding  9/29, PharmD, RPh  PGY-1 Pharmacy Resident 08/07/2020 10:49 AM  Please check [email or web address] for unit-specific pharmacy phone numbers.

## 2020-08-07 NOTE — Progress Notes (Signed)
ANTICOAGULATION CONSULT NOTE   Pharmacy Consult for Heparin Indication: chest pain/ACS, awaiting CABG  Allergies  Allergen Reactions  . Entresto [Sacubitril-Valsartan] Other (See Comments)    Severe weakness, fatigue, ? Contributed to syncopal episode    Patient Measurements: Height: 5\' 9"  (175.3 cm) Weight: 81 kg (178 lb 8 oz) IBW/kg (Calculated) : 70.7 Heparin Dosing Weight:  83 kg  Vital Signs: Temp: 98 F (36.7 C) (08/27 2348) Temp Source: Oral (08/27 2348) BP: 122/80 (08/27 2348) Pulse Rate: 60 (08/27 2348)  Labs: Recent Labs    08/05/20 0914 08/05/20 1458 08/05/20 1701 08/05/20 2237 08/06/20 0639 08/06/20 2138  HGB  --  14.9  --   --   --   --   HCT  --  44.2  --   --   --   --   PLT  --  324  --   --   --   --   HEPARINUNFRC  --   --   --  0.11* 0.18* <0.10*  CREATININE 0.82 0.89  --   --  1.00  --   TROPONINIHS  --  [id number]*  --   --   --     Estimated Creatinine Clearance: 85.4 mL/min (by C-G formula based on SCr of 1 mg/dL).   Medical History: Past Medical History:  Diagnosis Date  . Coronary artery disease   . GERD (gastroesophageal reflux disease)   . Heart attack (HCC) 12/1998    Assessment: 55 yoM admitted with NSTEMI started on IV heparin. Pt s/p LHC with multivessel CAD, CVTS consulted for possible surgical revascularization.  Pharmacy to resume heparin in 8hr from sheath removal (~0800).  8/28 AM update:  Heparin level low No issues per RN  Goal of Therapy:  Heparin level 0.3-0.7 units/ml Monitor platelets by anticoagulation protocol: Yes   Plan:  Inc heparin to 1400 units//hr 1030 heparin level  9/28, PharmD, BCPS Clinical Pharmacist Phone: [phone]

## 2020-08-07 NOTE — Progress Notes (Addendum)
Progress Note  Patient Name: Calvin Rodriguez Date of Encounter: 08/07/2020  [facility] Cardi[facility]:  Calvin Rodriguez  Subjective   No angina, no dyspnea. Upset and agitated. No cigarettes for 2 days. Was unable to see his son (he cannot visit the hospital, would be very disturbing due to autism) or prepare at work. Refused last labs (heparin anti Xa).  Inpatient Medications    Scheduled Meds: . aspirin EC  81 mg Oral Daily  . atorvastatin  80 mg Oral Daily  . carvedilol  3.125 mg Oral BID WC  . dapagliflozin propanediol  5 mg Oral Daily  . mometasone-formoterol  2 puff Inhalation BID  . sodium chloride flush  3 mL Intravenous Q12H  . sodium chloride flush  3 mL Intravenous Q12H   Continuous Infusions: . sodium chloride    . sodium chloride    . heparin 1,400 Units/hr (08/07/20 0208)   PRN Meds: sodium chloride, sodium chloride, acetaminophen, ALPRAZolam, nitroGLYCERIN, ondansetron (ZOFRAN) IV, sodium chloride flush, sodium chloride flush, zolpidem   Vital Signs    Vitals:   08/06/20 1954 08/06/20 2348 08/07/20 0500 08/07/20 0856  BP:  122/80 124/80 120/75  Pulse: 74 60 82 72  Resp: 18 18 20    Temp: 98.6 F (37 C) 98 F (36.7 C) 98 F (36.7 C)   TempSrc: Oral Oral Oral   SpO2: 98% 98% 98%   Weight:   80.1 kg   Height:       No intake or output data in the 24 hours ending 08/07/20 1155 Last 3 Weights 08/07/2020 08/06/2020 08/05/2020  Weight (lbs) 176 lb 9.4 oz 178 lb 8 oz 178 lb  Weight (kg) 80.1 kg 80.967 kg 80.74 kg      Telemetry    NSR - Personally Reviewed  ECG    NSR, IVCD, LAD - Personally Reviewed  Physical Exam  Lean. Anxious, agitated GEN: No acute distress.   Neck: No JVD Cardiac: RRR, no murmurs, rubs, or gallops.  Respiratory: Clear to auscultation bilaterally. GI: Soft, nontender, non-distended  MS: No edema; No deformity. Neuro:  Nonfocal  Psych: Anxious  Labs    High Sensitivity Troponin:   Recent Labs  Lab  08/05/20 1458 08/05/20 1701  TROPONINIHS 9,076* 9,897*      Chemistry Recent Labs  Lab 08/05/20 1458 08/06/20 0639 08/07/20 0219  NA 140 141 139  K 3.3* 4.0 3.7  CL 103 107 106  CO2 25 25 25   GLUCOSE 106* 93 99  BUN 13 13 11   CREATININE 0.89 1.00 0.86  CALCIUM 8.9 8.6* 8.2*  PROT  --  6.1*  --   ALBUMIN  --  3.3*  --   AST  --  36  --   ALT  --  27  --   ALKPHOS  --  55  --   BILITOT  --  1.0  --   GFRNONAA >60 >60 >60  GFRAA >60 >60 >60  ANIONGAP 12 9 8      Hematology Recent Labs  Lab 08/05/20 1458 08/07/20 0219  WBC 9.6 8.9  RBC 4.71 4.53  HGB 14.9 14.4  HCT 44.2 43.5  MCV 93.8 96.0  MCH 31.6 31.8  MCHC 33.7 33.1  RDW 13.4 13.5  PLT 324 282    BNP Recent Labs  Lab 08/05/20 1458  BNP 422.9*     DDimer No results for input(s): DDIMER in the last 168 hours.   Radiology    DG Chest 2 View  Result Date: 08/05/2020 CLINICAL DATA:  54 year old male with syncope several days ago, pain radiating to the left shoulder. EXAM: CHEST - 2 VIEW COMPARISON:  Portable chest 06/27/2010. FINDINGS: PA and lateral views. Mild thoracolumbar scoliosis. Cardiac size and mediastinal contours are within normal limits. Visualized tracheal air column is within normal limits. Both lungs appear clear. No pneumothorax or pleural effusion. Degeneration in the spine. No acute osseous abnormality identified. Negative visible bowel gas pattern. IMPRESSION: 1. No cardiopulmonary abnormality. 2. Scoliosis and spine degeneration. Electronically Signed   By: Odessa Fleming M.D.   On: 08/05/2020 15:18   CT chest without contrast  Result Date: 08/06/2020 CLINICAL DATA:  Aortic disease, nontraumatic Coronary artery disease, possible preop CABG. EXAM: CT CHEST WITHOUT CONTRAST TECHNIQUE: Multidetector CT imaging of the chest was performed following the standard protocol without IV contrast. COMPARISON:  Chest radiograph yesterday. FINDINGS: Cardiovascular: The thoracic aorta is normal in caliber.  Trace aortic atherosclerosis of the isthmus. No other calcified aortic plaque. Coronary artery calcifications/stents. Heart is normal in size. There is no pericardial effusion. Normal caliber main pulmonary artery. Mediastinum/Nodes: No enlarged mediastinal lymph nodes. No bulky hilar adenopathy, evaluation limited in the absence of IV contrast. No visualized thyroid nodule. Slightly patulous distal esophagus without wall thickening. Lungs/Pleura: Mild paraseptal emphysema. Mild central bronchial thickening. Mild dependent atelectasis in both lower lobes. No confluent airspace disease. No pleural fluid. No pulmonary mass or suspicious nodule. Upper Abdomen: Gallstones within the gallbladder. No pericholecystic inflammation. Minimal excreted IV contrast in both renal collecting systems from prior coronary angiogram. No acute upper abdominal findings. Musculoskeletal: Mild scoliosis and degenerative change in the spine. There are no acute or suspicious osseous abnormalities. IMPRESSION: 1. Trace atherosclerosis of the thoracic aorta.  No aneurysm. 2. Coronary artery calcifications/stents. 3. Mild emphysema. Aortic Atherosclerosis (ICD10-I70.0) and Emphysema (ICD10-J43.9). Electronically Signed   By: Narda Rutherford M.D.   On: 08/06/2020 22:08   CARDIAC CATHETERIZATION  Result Date: 08/06/2020  Mid LM lesion is 35% stenosed.  Prox LAD to Mid LAD lesion is 80% stenosed.  1st Diag lesion is 90% stenosed.  Ramus lesion is 60% stenosed.  Prox Cx to Mid Cx lesion is 80% stenosed.  Prox RCA to Mid RCA lesion is 100% stenosed.  There is severe left ventricular systolic dysfunction.  LV end diastolic pressure is mildly elevated.  The left ventricular ejection fraction is less than 25% by visual estimate.  1. Severe 3 vessel obstructive CAD 2. Severe LV dysfunction. EF estimated at 20-25% 3. Mildly elevated LVEDP Plan: recommend CT surgery consult for CABG.  ECHOCARDIOGRAM COMPLETE  Result Date: 08/06/2020     ECHOCARDIOGRAM REPORT   Patient Name:   Calvin Rodriguez Date of Exam: 08/06/2020 Medical Rec #:  [id number]              Height:       69.0 in Accession #:    [id number]             Weight:       178.5 lb Date of Birth:  [birth year]               BSA:          1.969 m Patient Age:    54 years               BP:           110/79 mmHg Patient Gender: M  HR:           75 bpm. Exam Location:  Inpatient Procedure: 2D Echo, Cardiac Doppler and Color Doppler Indications:    NSTEMI I21.4  History:        Patient has prior history of Echocardiogram examinations, most                 recent 02/12/2020. Previous Myocardial Infarction and CAD; Risk                 Factors:Hypertension and Current Smoker.  Sonographer:    Eulah Pont RDCS Referring Phys: 73 RHONDA G BARRETT IMPRESSIONS  1. Left ventricular ejection fraction, by estimation, is 25 to 30%. The left ventricle has severely decreased function. The left ventricle demonstrates global hypokinesis. Left ventricular diastolic parameters are consistent with Grade II diastolic dysfunction (pseudonormalization).  2. Right ventricular systolic function is normal. The right ventricular size is normal.  3. Left atrial size was mildly dilated.  4. The mitral valve is normal in structure. Mild mitral valve regurgitation. No evidence of mitral stenosis.  5. The aortic valve is normal in structure. Aortic valve regurgitation is not visualized. No aortic stenosis is present.  6. The inferior vena cava is normal in size with greater than 50% respiratory variability, suggesting right atrial pressure of 3 mmHg. FINDINGS  Left Ventricle: Left ventricular ejection fraction, by estimation, is 25 to 30%. The left ventricle has severely decreased function. The left ventricle demonstrates global hypokinesis. Definity contrast agent was given IV to delineate the left ventricular endocardial borders. The left ventricular internal cavity size was normal in size. There is  no left ventricular hypertrophy. Left ventricular diastolic parameters are consistent with Grade II diastolic dysfunction (pseudonormalization).  LV Wall Scoring: The mid and distal anterior wall, mid and distal anterior septum, entire apex, mid and distal inferior wall, and mid inferoseptal segment are akinetic. Right Ventricle: The right ventricular size is normal. No increase in right ventricular wall thickness. Right ventricular systolic function is normal. Left Atrium: Left atrial size was mildly dilated. Right Atrium: Right atrial size was normal in size. Pericardium: There is no evidence of pericardial effusion. Mitral Valve: The mitral valve is normal in structure. Normal mobility of the mitral valve leaflets. Mild mitral valve regurgitation. No evidence of mitral valve stenosis. Tricuspid Valve: The tricuspid valve is normal in structure. Tricuspid valve regurgitation is not demonstrated. No evidence of tricuspid stenosis. Aortic Valve: The aortic valve is normal in structure. Aortic valve regurgitation is not visualized. No aortic stenosis is present. Pulmonic Valve: The pulmonic valve was normal in structure. Pulmonic valve regurgitation is not visualized. No evidence of pulmonic stenosis. Aorta: The aortic root is normal in size and structure. Venous: The inferior vena cava is normal in size with greater than 50% respiratory variability, suggesting right atrial pressure of 3 mmHg. IAS/Shunts: No atrial level shunt detected by color flow Doppler.  LEFT VENTRICLE PLAX 2D LVIDd:         6.05 cm      Diastology LVIDs:         5.29 cm      LV e' lateral:   10.30 cm/s LV PW:         0.74 cm      LV E/e' lateral: 9.0 LV IVS:        0.73 cm      LV e' medial:    4.31 cm/s LVOT diam:     2.10 cm  LV E/e' medial:  21.5 LV SV:         54 LV SV Index:   28 LVOT Area:     3.46 cm  LV Volumes (MOD) LV vol d, MOD A2C: 181.0 ml LV vol d, MOD A4C: 148.0 ml LV vol s, MOD A2C: 132.0 ml LV vol s, MOD A4C: 98.4 ml LV SV  MOD A2C:     49.0 ml LV SV MOD A4C:     148.0 ml LV SV MOD BP:      50.0 ml RIGHT VENTRICLE RV S prime:     9.83 cm/s TAPSE (M-mode): 1.1 cm LEFT ATRIUM             Index       RIGHT ATRIUM           Index LA diam:        3.20 cm 1.63 cm/m  RA Area:     11.40 cm LA Vol (A2C):   53.4 ml 27.13 ml/m RA Volume:   23.40 ml  11.89 ml/m LA Vol (A4C):   78.6 ml 39.93 ml/m LA Biplane Vol: 67.0 ml 34.04 ml/m  AORTIC VALVE LVOT Vmax:   85.00 cm/s LVOT Vmean:  59.800 cm/s LVOT VTI:    0.157 m  AORTA Ao Root diam: 3.30 cm Ao Asc diam:  3.40 cm MITRAL VALVE MV Area (PHT): 6.17 cm    SHUNTS MV Decel Time: 123 msec    Systemic VTI:  0.16 m MV E velocity: 92.60 cm/s  Systemic Diam: 2.10 cm MV A velocity: 51.90 cm/s MV E/A ratio:  1.78 Donato Schultz MD Electronically signed by Donato Schultz MD Signature Date/Time: 08/06/2020/2:41:37 PM    Final    VAS US DOPPLER PRE CABG  Result Date: 08/06/2020 PREOPERATIVE VASCULAR EVALUATION  Indications:      Pre-CABG. Risk Factors:     Hypertension. Comparison Study: No prior studies. Performing Technologist: Olen Cordial RVT  Examination Guidelines: A complete evaluation includes B-mode imaging, spectral Doppler, color Doppler, and power Doppler as needed of all accessible portions of each vessel. Bilateral testing is considered an integral part of a complete examination. Limited examinations for reoccurring indications may be performed as noted.  Right Carotid Findings: +----------+--------+--------+--------+-----------------------+--------+           PSV cm/sEDV cm/sStenosisDescribe               Comments +----------+--------+--------+--------+-----------------------+--------+ CCA Prox  114     30              smooth and heterogenous         +----------+--------+--------+--------+-----------------------+--------+ CCA Distal90      36              smooth and heterogenous         +----------+--------+--------+--------+-----------------------+--------+ ICA Prox  72       23              smooth and heterogenous         +----------+--------+--------+--------+-----------------------+--------+ ICA Distal76      36                                              +----------+--------+--------+--------+-----------------------+--------+ ECA       72      13                                              +----------+--------+--------+--------+-----------------------+--------+  Portions of this table do not appear on this page. +----------+--------+-------+--------+------------+           PSV cm/sEDV cmsDescribeArm Pressure +----------+--------+-------+--------+------------+ Subclavian101                                 +----------+--------+-------+--------+------------+ +---------+--------+--+--------+--+---------+ VertebralPSV cm/s52EDV cm/s21Antegrade +---------+--------+--+--------+--+---------+ Left Carotid Findings: +----------+--------+--------+--------+-----------------------+--------+           PSV cm/sEDV cm/sStenosisDescribe               Comments +----------+--------+--------+--------+-----------------------+--------+ CCA Prox  125     44              smooth and heterogenous         +----------+--------+--------+--------+-----------------------+--------+ CCA Distal73      25              smooth and heterogenous         +----------+--------+--------+--------+-----------------------+--------+ ICA Prox  51      22              smooth and heterogenous         +----------+--------+--------+--------+-----------------------+--------+ ICA Distal65      29                                              +----------+--------+--------+--------+-----------------------+--------+ ECA       87      18                                              +----------+--------+--------+--------+-----------------------+--------+ +----------+--------+--------+--------+------------+ SubclavianPSV cm/sEDV cm/sDescribeArm Pressure  +----------+--------+--------+--------+------------+           110                                  +----------+--------+--------+--------+------------+ +---------+--------+--+--------+--+---------+ VertebralPSV cm/s50EDV cm/s22Antegrade +---------+--------+--+--------+--+---------+  ABI Findings: +--------+------------------+-----+---------+--------+ Right   Rt Pressure (mmHg)IndexWaveform Comment  +--------+------------------+-----+---------+--------+ [id number]                    triphasic         +--------+------------------+-----+---------+--------+ PTA     108               0.95 triphasic         +--------+------------------+-----+---------+--------+ DP      103               0.90 triphasic         +--------+------------------+-----+---------+--------+ +--------+------------------+-----+---------+-------+ Left    Lt Pressure (mmHg)IndexWaveform Comment +--------+------------------+-----+---------+-------+ [id number]                    triphasic        +--------+------------------+-----+---------+-------+ PTA     132               1.16 triphasic        +--------+------------------+-----+---------+-------+ DP      101               0.89 triphasic        +--------+------------------+-----+---------+-------+ +-------+---------------+----------------+ ABI/TBIToday's ABI/TBIPrevious ABI/TBI +-------+---------------+----------------+ Right  0.95                            +-------+---------------+----------------+  Left   1.16                            +-------+---------------+----------------+  Right Doppler Findings: +--------+--------+-----+---------+--------+ Site    PressureIndexDoppler  Comments +--------+--------+-----+---------+--------+ [id number]          triphasic         +--------+--------+-----+---------+--------+ Radial               biphasic          +--------+--------+-----+---------+--------+ Ulnar                 triphasic         +--------+--------+-----+---------+--------+  Left Doppler Findings: +--------+--------+-----+---------+--------+ Site    PressureIndexDoppler  Comments +--------+--------+-----+---------+--------+ [id number]          triphasic         +--------+--------+-----+---------+--------+ Radial               triphasic         +--------+--------+-----+---------+--------+ Ulnar                triphasic         +--------+--------+-----+---------+--------+  Summary: Right Carotid: Velocities in the right ICA are consistent with a 1-39% stenosis. Left Carotid: Velocities in the left ICA are consistent with a 1-39% stenosis. Vertebrals: Bilateral vertebral arteries demonstrate antegrade flow. Right ABI: Resting right ankle-brachial index is within normal range. No evidence of significant right lower extremity arterial disease. Left ABI: Resting left ankle-brachial index is within normal range. No evidence of significant left lower extremity arterial disease. Right Upper Extremity: Doppler waveform obliterate with right radial compression. Doppler waveform obliterate with right ulnar compression. Left Upper Extremity: Doppler waveform obliterate with left radial compression. Doppler waveform obliterate with left ulnar compression.  Electronically signed by Lemar Livings MD on 08/06/2020 at 4:54:32 PM.    Final     Cardiac Studies   As above  Patient Profile     54 y.o. male with multivessel CAD, smoker/prediabetes, presenting with elevated cardiac enzymes and severely depressed LVEF. Has had recurrent syncope.  Assessment & Plan    He is very upset. He would have liked time to assimilate the information and the need for surgery. He is worried about financial issues. Not able to see his autistic son, who would not do well with a hospital visit. Nicotine withdrawal is not helping.  Strongly encouraged him not to sign out AMA. He is truly at risk of dying due to  sudden arrhythmic death. Will give nicotine replacement and prn benzodiazepine. Will try to get all labs once daily.  For questions or updates, please contact [facility] Please consult [email or web address] for contact info under        Signed, Thurmon Fair, MD  08/07/2020, 11:55 AM

## 2020-08-08 ENCOUNTER — Other Ambulatory Visit (HOSPITAL_COMMUNITY): Payer: Commercial Managed Care - PPO

## 2020-08-08 LAB — CBC
HCT: 40.5 % (ref 39.0–52.0)
Hemoglobin: 13.3 g/dL (ref 13.0–17.0)
MCH: 31.3 pg (ref 26.0–34.0)
MCHC: 32.8 g/dL (ref 30.0–36.0)
MCV: 95.3 fL (ref 80.0–100.0)
Platelets: 264 10*3/uL (ref 150–400)
RBC: 4.25 MIL/uL (ref 4.22–5.81)
RDW: 13.2 % (ref 11.5–15.5)
WBC: 8.9 10*3/uL (ref 4.0–10.5)
nRBC: 0 % (ref 0.0–0.2)

## 2020-08-08 LAB — HEPARIN LEVEL (UNFRACTIONATED)
Heparin Unfractionated: 0.25 IU/mL — ABNORMAL LOW (ref 0.30–0.70)
Heparin Unfractionated: 0.29 IU/mL — ABNORMAL LOW (ref 0.30–0.70)

## 2020-08-08 LAB — ABO/RH: ABO/RH(D): A POS

## 2020-08-08 LAB — PREPARE RBC (CROSSMATCH)

## 2020-08-08 MED ORDER — PLASMA-LYTE 148 IV SOLN
INTRAVENOUS | Status: DC
  Filled 2020-08-08: qty 2.5

## 2020-08-08 MED ORDER — CHLORHEXIDINE GLUCONATE 0.12 % MT SOLN
15.0000 mL | Freq: Once | OROMUCOSAL | Status: AC
  Administered 2020-08-09: 15 mL via OROMUCOSAL
  Filled 2020-08-08: qty 15

## 2020-08-08 MED ORDER — TRANEXAMIC ACID (OHS) BOLUS VIA INFUSION
15.0000 mg/kg | INTRAVENOUS | Status: AC
  Administered 2020-08-09: 1236 mg via INTRAVENOUS
  Filled 2020-08-08: qty 1236

## 2020-08-08 MED ORDER — NITROGLYCERIN IN D5W 200-5 MCG/ML-% IV SOLN
2.0000 ug/min | INTRAVENOUS | Status: DC
  Filled 2020-08-08: qty 250

## 2020-08-08 MED ORDER — BISACODYL 5 MG PO TBEC
5.0000 mg | DELAYED_RELEASE_TABLET | Freq: Once | ORAL | Status: AC
  Administered 2020-08-08: 5 mg via ORAL
  Filled 2020-08-08: qty 1

## 2020-08-08 MED ORDER — PHENYLEPHRINE HCL-NACL 20-0.9 MG/250ML-% IV SOLN
30.0000 ug/min | INTRAVENOUS | Status: AC
  Administered 2020-08-09: 20 ug/min via INTRAVENOUS
  Filled 2020-08-08: qty 250

## 2020-08-08 MED ORDER — POTASSIUM CHLORIDE 2 MEQ/ML IV SOLN
80.0000 meq | INTRAVENOUS | Status: DC
  Filled 2020-08-08: qty 40

## 2020-08-08 MED ORDER — NOREPINEPHRINE 4 MG/250ML-% IV SOLN
0.0000 ug/min | INTRAVENOUS | Status: AC
  Administered 2020-08-09: 3 ug/min via INTRAVENOUS
  Filled 2020-08-08: qty 250

## 2020-08-08 MED ORDER — CARVEDILOL 3.125 MG PO TABS
3.1250 mg | ORAL_TABLET | Freq: Two times a day (BID) | ORAL | Status: AC
  Administered 2020-08-09: 3.125 mg via ORAL
  Filled 2020-08-08: qty 1

## 2020-08-08 MED ORDER — CHLORHEXIDINE GLUCONATE 4 % EX LIQD
60.0000 mL | Freq: Once | CUTANEOUS | Status: AC
  Administered 2020-08-08: 4 via TOPICAL
  Filled 2020-08-08: qty 60

## 2020-08-08 MED ORDER — VANCOMYCIN HCL 1500 MG/300ML IV SOLN
1500.0000 mg | INTRAVENOUS | Status: AC
  Administered 2020-08-09: 1500 mg via INTRAVENOUS
  Filled 2020-08-08: qty 300

## 2020-08-08 MED ORDER — TEMAZEPAM 15 MG PO CAPS
15.0000 mg | ORAL_CAPSULE | Freq: Once | ORAL | Status: AC | PRN
  Administered 2020-08-08: 15 mg via ORAL
  Filled 2020-08-08: qty 1

## 2020-08-08 MED ORDER — TRANEXAMIC ACID (OHS) PUMP PRIME SOLUTION
2.0000 mg/kg | INTRAVENOUS | Status: DC
  Filled 2020-08-08: qty 1.65

## 2020-08-08 MED ORDER — EPINEPHRINE HCL 5 MG/250ML IV SOLN IN NS
0.0000 ug/min | INTRAVENOUS | Status: AC
  Administered 2020-08-09: 12:00:00 3 ug/min via INTRAVENOUS
  Filled 2020-08-08: qty 250

## 2020-08-08 MED ORDER — CHLORHEXIDINE GLUCONATE 4 % EX LIQD
60.0000 mL | Freq: Once | CUTANEOUS | Status: AC
  Administered 2020-08-09: 4 via TOPICAL
  Filled 2020-08-08: qty 60

## 2020-08-08 MED ORDER — SODIUM CHLORIDE 0.9 % IV SOLN
INTRAVENOUS | Status: DC
  Filled 2020-08-08: qty 30

## 2020-08-08 MED ORDER — SODIUM CHLORIDE 0.9 % IV SOLN
750.0000 mg | INTRAVENOUS | Status: AC
  Administered 2020-08-09: 750 mg via INTRAVENOUS
  Filled 2020-08-08: qty 750

## 2020-08-08 MED ORDER — SODIUM CHLORIDE 0.9 % IV SOLN
1.5000 g | INTRAVENOUS | Status: AC
  Administered 2020-08-09: 1.5 g via INTRAVENOUS
  Filled 2020-08-08: qty 1.5

## 2020-08-08 MED ORDER — INSULIN REGULAR(HUMAN) IN NACL 100-0.9 UT/100ML-% IV SOLN
INTRAVENOUS | Status: AC
  Administered 2020-08-09: 1.2 [IU]/h via INTRAVENOUS
  Filled 2020-08-08: qty 100

## 2020-08-08 MED ORDER — MAGNESIUM SULFATE 50 % IJ SOLN
40.0000 meq | INTRAMUSCULAR | Status: DC
  Filled 2020-08-08: qty 9.85

## 2020-08-08 MED ORDER — MILRINONE LACTATE IN DEXTROSE 20-5 MG/100ML-% IV SOLN
0.3000 ug/kg/min | INTRAVENOUS | Status: AC
  Administered 2020-08-09: .25 ug/kg/min via INTRAVENOUS
  Filled 2020-08-08: qty 100

## 2020-08-08 MED ORDER — TRANEXAMIC ACID 1000 MG/10ML IV SOLN
1.5000 mg/kg/h | INTRAVENOUS | Status: AC
  Administered 2020-08-09: 1.5 mg/kg/h via INTRAVENOUS
  Filled 2020-08-08: qty 25

## 2020-08-08 MED ORDER — DEXMEDETOMIDINE HCL IN NACL 400 MCG/100ML IV SOLN
0.1000 ug/kg/h | INTRAVENOUS | Status: AC
  Administered 2020-08-09: .3 ug/kg/h via INTRAVENOUS
  Filled 2020-08-08: qty 100

## 2020-08-08 NOTE — Progress Notes (Signed)
Pharmacist Heart Failure Core Measure Documentation  Assessment: Hazen Brumett IV [facility] has an EF documented as 25-30% on 08/06/20 by ECHO.  Rationale: Heart failure patients with left ventricular systolic dysfunction (LVSD) and an EF < 40% should be prescribed an angiotensin converting enzyme inhibitor (ACEI) or angiotensin receptor blocker (ARB) at discharge unless a contraindication is documented in the medical record.  This patient is not currently on an ACEI or ARB for HF.  This note is being placed in the record in order to provide documentation that a contraindication to the use of these agents is present for this encounter.  ACE Inhibitor or Angiotensin Receptor Blocker is contraindicated (specify all that apply)  []   ACEI allergy AND ARB allergy []   Angioedema []   Moderate or severe aortic stenosis []   Hyperkalemia [x]   Hypotension []   Renal artery stenosis []   Worsening renal function, preexisting renal disease or dysfunction  The patient was hypotensive on Entresto and reports severe weakness and fatigue on the medication. Per cardiology, the patient is euvolemic and well compensated and due to hypotension with Entresto, will hold off of ACE-I/ARB.  , PharmD, RPh  PGY-1 Pharmacy Resident 08/08/2020 9:45 AM  Please check [email or web address] for unit-specific pharmacy phone numbers.

## 2020-08-08 NOTE — Progress Notes (Addendum)
ANTICOAGULATION CONSULT NOTE   Pharmacy Consult for Heparin Indication: chest pain/ACS (patient awaiting CABG 8/30)  Allergies  Allergen Reactions  . Entresto [Sacubitril-Valsartan] Other (See Comments)    Severe weakness, fatigue, ? Contributed to syncopal episode    Patient Measurements: Height: 5\' 9"  (175.3 cm) Weight: 82.4 kg (181 lb 9.6 oz) IBW/kg (Calculated) : 70.7 Heparin Dosing Weight:  83 kg  Vital Signs: Temp: 97.8 F (36.6 C) (08/29 0811) Temp Source: Oral (08/29 0811) BP: 107/75 (08/29 0811) Pulse Rate: 75 (08/29 0811)  Labs: Recent Labs    08/05/20 1458 08/05/20 1458 08/05/20 1701 08/05/20 2237 08/06/20 0639 08/06/20 2138 08/07/20 0219 08/08/20 0050  HGB 14.9   < >  --   --   --   --  14.4 13.3  HCT 44.2  --   --   --   --   --  43.5 40.5  PLT 324  --   --   --   --   --  282 264  LABPROT  --   --   --   --   --   --  12.7  --   INR  --   --   --   --   --   --  1.0  --   HEPARINUNFRC  --   --   --    < > 0.18* <0.10*  --  0.25*  CREATININE 0.89  --   --   --  1.00  --  0.86  --   TROPONINIHS 9,076*  --  08/10/20*  --   --   --   --   --    < > = values in this interval not displayed.    Estimated Creatinine Clearance: 99.3 mL/min (by C-G formula based on SCr of 0.86 mg/dL).   Medical History: Past Medical History:  Diagnosis Date  . Coronary artery disease   . GERD (gastroesophageal reflux disease)   . Heart attack [facility]) 12/1998    Assessment: 54 yo male admitted with NSTEMI. The patient is tentatively planned for CABG procedure on 8/30 s/p LHC with multivessel CAD. Pharmacy is consulted to dose heparin. CBC is WNL and stable. There are no issues with the infusion or signs and symptoms of bleeding noted.   The patient's heparin level was slightly subtherapeutic at 0.29 after running at 1500 units/hour. Pharmacy is to resume heparin 8 hours from sheath removal (~0800). There were no issues with the infusion or signs and symptoms of bleeding.    Per Dr. 04-23-1970, will try to order all labs daily rather than continual 6 hour heparin levels. The patient is right on the border of being therapeutic, therefore, will increase rate slightly and defer to AM labs.  Goal of Therapy:  Heparin level 0.3-0.7 units/ml Monitor platelets by anticoagulation protocol: Yes   Plan:  Increase heparin IV to 1600 units/hr Obtain daily CBC and HL Monitor for signs and symptoms of bleeding  Royann Shivers, PharmD, RPh  PGY-1 Pharmacy Resident 08/08/2020 9:33 AM  Please check [email or web address] for unit-specific pharmacy phone numbers.

## 2020-08-08 NOTE — Progress Notes (Signed)
Progress Note  Patient Name: Calvin Rodriguez Date of Encounter: 08/08/2020  [facility] Cardiologist: Bing MatterKrasowski  Subjective   Better mood today, less anxious. Nicotine patch helped. Denies angina and dyspnea.  Inpatient Medications    Scheduled Meds:  aspirin EC  81 mg Oral Daily   atorvastatin  80 mg Oral Daily   carvedilol  3.125 mg Oral BID WC   dapagliflozin propanediol  5 mg Oral Daily   mometasone-formoterol  2 puff Inhalation BID   nicotine  14 mg Transdermal Daily   sodium chloride flush  3 mL Intravenous Q12H   sodium chloride flush  3 mL Intravenous Q12H   Continuous Infusions:  sodium chloride     sodium chloride     heparin 1,500 Units/hr (08/08/20 0814)   PRN Meds: sodium chloride, sodium chloride, acetaminophen, ALPRAZolam, nicotine polacrilex, nitroGLYCERIN, ondansetron (ZOFRAN) IV, sodium chloride flush, sodium chloride flush, zolpidem   Vital Signs    Vitals:   08/07/20 1947 08/07/20 2331 08/08/20 0537 08/08/20 0811  BP: 130/86 128/86 112/85 107/75  Pulse: 85 85 76 75  Resp: 18 18 18 16   Temp: 98 F (36.7 C) 98.6 F (37 C) 98.1 F (36.7 C) 97.8 F (36.6 C)  TempSrc: Oral Oral Oral Oral  SpO2: 98% 98%  98%  Weight:   82.4 kg   Height:       No intake or output data in the 24 hours ending 08/08/20 0930 Last 3 Weights 08/08/2020 08/07/2020 08/06/2020  Weight (lbs) 181 lb 9.6 oz 176 lb 9.4 oz 178 lb 8 oz  Weight (kg) 82.373 kg 80.1 kg 80.967 kg      Telemetry    NSR - Personally Reviewed  ECG    No new tracing - Personally Reviewed  Physical Exam  Calm GEN: No acute distress.   Neck: No JVD Cardiac: RRR, no murmurs, rubs, or gallops.  Respiratory: Clear to auscultation bilaterally. GI: Soft, nontender, non-distended  MS: No edema; No deformity. Neuro:  Nonfocal  Psych: Normal affect   Labs    High Sensitivity Troponin:   Recent Labs  Lab 08/05/20 1458 08/05/20 1701  TROPONINIHS 9,076* 9,897*       Chemistry Recent Labs  Lab 08/05/20 1458 08/06/20 0639 08/07/20 0219  NA 140 141 139  K 3.3* 4.0 3.7  CL 103 107 106  CO2 25 25 25   GLUCOSE 106* 93 99  BUN 13 13 11   CREATININE 0.89 1.00 0.86  CALCIUM 8.9 8.6* 8.2*  PROT  --  6.1*  --   ALBUMIN  --  3.3*  --   AST  --  36  --   ALT  --  27  --   ALKPHOS  --  55  --   BILITOT  --  1.0  --   GFRNONAA >60 >60 >60  GFRAA >60 >60 >60  ANIONGAP 12 9 8      Hematology Recent Labs  Lab 08/05/20 1458 08/07/20 0219 08/08/20 0050  WBC 9.6 8.9 8.9  RBC 4.71 4.53 4.25  HGB 14.9 14.4 13.3  HCT 44.2 43.5 40.5  MCV 93.8 96.0 95.3  MCH 31.6 31.8 31.3  MCHC 33.7 33.1 32.8  RDW 13.4 13.5 13.2  PLT 324 282 264    BNP Recent Labs  Lab 08/05/20 1458  BNP 422.9*     DDimer No results for input(s): DDIMER in the last 168 hours.   Radiology    CT chest without contrast  Result Date: 08/06/2020  CLINICAL DATA:  Aortic disease, nontraumatic Coronary artery disease, possible preop CABG. EXAM: CT CHEST WITHOUT CONTRAST TECHNIQUE: Multidetector CT imaging of the chest was performed following the standard protocol without IV contrast. COMPARISON:  Chest radiograph yesterday. FINDINGS: Cardiovascular: The thoracic aorta is normal in caliber. Trace aortic atherosclerosis of the isthmus. No other calcified aortic plaque. Coronary artery calcifications/stents. Heart is normal in size. There is no pericardial effusion. Normal caliber main pulmonary artery. Mediastinum/Nodes: No enlarged mediastinal lymph nodes. No bulky hilar adenopathy, evaluation limited in the absence of IV contrast. No visualized thyroid nodule. Slightly patulous distal esophagus without wall thickening. Lungs/Pleura: Mild paraseptal emphysema. Mild central bronchial thickening. Mild dependent atelectasis in both lower lobes. No confluent airspace disease. No pleural fluid. No pulmonary mass or suspicious nodule. Upper Abdomen: Gallstones within the gallbladder. No  pericholecystic inflammation. Minimal excreted IV contrast in both renal collecting systems from prior coronary angiogram. No acute upper abdominal findings. Musculoskeletal: Mild scoliosis and degenerative change in the spine. There are no acute or suspicious osseous abnormalities. IMPRESSION: 1. Trace atherosclerosis of the thoracic aorta.  No aneurysm. 2. Coronary artery calcifications/stents. 3. Mild emphysema. Aortic Atherosclerosis (ICD10-I70.0) and Emphysema (ICD10-J43.9). Electronically Signed   By: Narda Rutherford M.D.   On: 08/06/2020 22:08   ECHOCARDIOGRAM COMPLETE  Result Date: 08/06/2020    ECHOCARDIOGRAM REPORT   Patient Name:   Calvin Rodriguez Date of Exam: 08/06/2020 Medical Rec #:  [id number]              Height:       69.0 in Accession #:    [id number]             Weight:       178.5 lb Date of Birth:  Apr 08, 1966               BSA:          1.969 m Patient Age:    54 years               BP:           110/79 mmHg Patient Gender: M                      HR:           75 bpm. Exam Location:  Inpatient Procedure: 2D Echo, Cardiac Doppler and Color Doppler Indications:    NSTEMI I21.4  History:        Patient has prior history of Echocardiogram examinations, most                 recent 02/12/2020. Previous Myocardial Infarction and CAD; Risk                 Factors:Hypertension and Current Smoker.  Sonographer:    Eulah Pont RDCS Referring Phys: [id number] RHONDA G BARRETT IMPRESSIONS  1. Left ventricular ejection fraction, by estimation, is 25 to 30%. The left ventricle has severely decreased function. The left ventricle demonstrates global hypokinesis. Left ventricular diastolic parameters are consistent with Grade II diastolic dysfunction (pseudonormalization).  2. Right ventricular systolic function is normal. The right ventricular size is normal.  3. Left atrial size was mildly dilated.  4. The mitral valve is normal in structure. Mild mitral valve regurgitation. No evidence of mitral stenosis.   5. The aortic valve is normal in structure. Aortic valve regurgitation is not visualized. No aortic stenosis is present.  6. The inferior vena cava is normal in  size with greater than 50% respiratory variability, suggesting right atrial pressure of 3 mmHg. FINDINGS  Left Ventricle: Left ventricular ejection fraction, by estimation, is 25 to 30%. The left ventricle has severely decreased function. The left ventricle demonstrates global hypokinesis. Definity contrast agent was given IV to delineate the left ventricular endocardial borders. The left ventricular internal cavity size was normal in size. There is no left ventricular hypertrophy. Left ventricular diastolic parameters are consistent with Grade II diastolic dysfunction (pseudonormalization).  LV Wall Scoring: The mid and distal anterior wall, mid and distal anterior septum, entire apex, mid and distal inferior wall, and mid inferoseptal segment are akinetic. Right Ventricle: The right ventricular size is normal. No increase in right ventricular wall thickness. Right ventricular systolic function is normal. Left Atrium: Left atrial size was mildly dilated. Right Atrium: Right atrial size was normal in size. Pericardium: There is no evidence of pericardial effusion. Mitral Valve: The mitral valve is normal in structure. Normal mobility of the mitral valve leaflets. Mild mitral valve regurgitation. No evidence of mitral valve stenosis. Tricuspid Valve: The tricuspid valve is normal in structure. Tricuspid valve regurgitation is not demonstrated. No evidence of tricuspid stenosis. Aortic Valve: The aortic valve is normal in structure. Aortic valve regurgitation is not visualized. No aortic stenosis is present. Pulmonic Valve: The pulmonic valve was normal in structure. Pulmonic valve regurgitation is not visualized. No evidence of pulmonic stenosis. Aorta: The aortic root is normal in size and structure. Venous: The inferior vena cava is normal in size with  greater than 50% respiratory variability, suggesting right atrial pressure of 3 mmHg. IAS/Shunts: No atrial level shunt detected by color flow Doppler.  LEFT VENTRICLE PLAX 2D LVIDd:         6.05 cm      Diastology LVIDs:         5.29 cm      LV e' lateral:   10.30 cm/s LV PW:         0.74 cm      LV E/e' lateral: 9.0 LV IVS:        0.73 cm      LV e' medial:    4.31 cm/s LVOT diam:     2.10 cm      LV E/e' medial:  21.5 LV SV:         54 LV SV Index:   28 LVOT Area:     3.46 cm  LV Volumes (MOD) LV vol d, MOD A2C: 181.0 ml LV vol d, MOD A4C: 148.0 ml LV vol s, MOD A2C: 132.0 ml LV vol s, MOD A4C: 98.4 ml LV SV MOD A2C:     49.0 ml LV SV MOD A4C:     148.0 ml LV SV MOD BP:      50.0 ml RIGHT VENTRICLE RV S prime:     9.83 cm/s TAPSE (M-mode): 1.1 cm LEFT ATRIUM             Index       RIGHT ATRIUM           Index LA diam:        3.20 cm 1.63 cm/m  RA Area:     11.40 cm LA Vol (A2C):   53.4 ml 27.13 ml/m RA Volume:   23.40 ml  11.89 ml/m LA Vol (A4C):   78.6 ml 39.93 ml/m LA Biplane Vol: 67.0 ml 34.04 ml/m  AORTIC VALVE LVOT Vmax:   85.00 cm/s LVOT Vmean:  59.800 cm/s  LVOT VTI:    0.157 m  AORTA Ao Root diam: 3.30 cm Ao Asc diam:  3.40 cm MITRAL VALVE MV Area (PHT): 6.17 cm    SHUNTS MV Decel Time: 123 msec    Systemic VTI:  0.16 m MV E velocity: 92.60 cm/s  Systemic Diam: 2.10 cm MV A velocity: 51.90 cm/s MV E/A ratio:  1.78 Donato Schultz MD Electronically signed by Donato Schultz MD Signature Date/Time: 08/06/2020/2:41:37 PM    Final    VAS US DOPPLER PRE CABG  Result Date: 08/06/2020 PREOPERATIVE VASCULAR EVALUATION  Indications:      Pre-CABG. Risk Factors:     Hypertension. Comparison Study: No prior studies. Performing Technologist: Olen Cordial RVT  Examination Guidelines: A complete evaluation includes B-mode imaging, spectral Doppler, color Doppler, and power Doppler as needed of all accessible portions of each vessel. Bilateral testing is considered an integral part of a complete examination. Limited  examinations for reoccurring indications may be performed as noted.  Right Carotid Findings: +----------+--------+--------+--------+-----------------------+--------+             PSV cm/s EDV cm/s Stenosis Describe                Comments  +----------+--------+--------+--------+-----------------------+--------+  CCA Prox   114      30                smooth and heterogenous           +----------+--------+--------+--------+-----------------------+--------+  CCA Distal 90       36                smooth and heterogenous           +----------+--------+--------+--------+-----------------------+--------+  ICA Prox   72       23                smooth and heterogenous           +----------+--------+--------+--------+-----------------------+--------+  ICA Distal 76       36                                                  +----------+--------+--------+--------+-----------------------+--------+  ECA        72       13                                                  +----------+--------+--------+--------+-----------------------+--------+ Portions of this table do not appear on this page. +----------+--------+-------+--------+------------+             PSV cm/s EDV cms Describe Arm Pressure  +----------+--------+-------+--------+------------+  Subclavian 101                                     +----------+--------+-------+--------+------------+ +---------+--------+--+--------+--+---------+  Vertebral PSV cm/s 52 EDV cm/s 21 Antegrade  +---------+--------+--+--------+--+---------+ Left Carotid Findings: +----------+--------+--------+--------+-----------------------+--------+             PSV cm/s EDV cm/s Stenosis Describe                Comments  +----------+--------+--------+--------+-----------------------+--------+  CCA Prox   125      44  smooth and heterogenous           +----------+--------+--------+--------+-----------------------+--------+  CCA Distal 73       25                smooth and heterogenous            +----------+--------+--------+--------+-----------------------+--------+  ICA Prox   51       22                smooth and heterogenous           +----------+--------+--------+--------+-----------------------+--------+  ICA Distal 65       29                                                  +----------+--------+--------+--------+-----------------------+--------+  ECA        87       18                                                  +----------+--------+--------+--------+-----------------------+--------+ +----------+--------+--------+--------+------------+  Subclavian PSV cm/s EDV cm/s Describe Arm Pressure  +----------+--------+--------+--------+------------+             110                                      +----------+--------+--------+--------+------------+ +---------+--------+--+--------+--+---------+  Vertebral PSV cm/s 50 EDV cm/s 22 Antegrade  +---------+--------+--+--------+--+---------+  ABI Findings: +--------+------------------+-----+---------+--------+  Right    Rt Pressure (mmHg) Index Waveform  Comment   +--------+------------------+-----+---------+--------+  Brachial 109                      triphasic           +--------+------------------+-----+---------+--------+  PTA      108                0.95  triphasic           +--------+------------------+-----+---------+--------+  DP       103                0.90  triphasic           +--------+------------------+-----+---------+--------+ +--------+------------------+-----+---------+-------+  Left     Lt Pressure (mmHg) Index Waveform  Comment  +--------+------------------+-----+---------+-------+  Brachial 114                      triphasic          +--------+------------------+-----+---------+-------+  PTA      132                1.16  triphasic          +--------+------------------+-----+---------+-------+  DP       101                0.89  triphasic          +--------+------------------+-----+---------+-------+  +-------+---------------+----------------+  ABI/TBI Today's ABI/TBI Previous ABI/TBI  +-------+---------------+----------------+  Right   0.95                              +-------+---------------+----------------+  Left    1.16                              +-------+---------------+----------------+  Right Doppler Findings: +--------+--------+-----+---------+--------+  Site     Pressure Index Doppler   Comments  +--------+--------+-----+---------+--------+  Brachial 109            triphasic           +--------+--------+-----+---------+--------+  Radial                  biphasic            +--------+--------+-----+---------+--------+  Ulnar                   triphasic           +--------+--------+-----+---------+--------+  Left Doppler Findings: +--------+--------+-----+---------+--------+  Site     Pressure Index Doppler   Comments  +--------+--------+-----+---------+--------+  Brachial 114            triphasic           +--------+--------+-----+---------+--------+  Radial                  triphasic           +--------+--------+-----+---------+--------+  Ulnar                   triphasic           +--------+--------+-----+---------+--------+  Summary: Right Carotid: Velocities in the right ICA are consistent with a 1-39% stenosis. Left Carotid: Velocities in the left ICA are consistent with a 1-39% stenosis. Vertebrals: Bilateral vertebral arteries demonstrate antegrade flow. Right ABI: Resting right ankle-brachial index is within normal range. No evidence of significant right lower extremity arterial disease. Left ABI: Resting left ankle-brachial index is within normal range. No evidence of significant left lower extremity arterial disease. Right Upper Extremity: Doppler waveform obliterate with right radial compression. Doppler waveform obliterate with right ulnar compression. Left Upper Extremity: Doppler waveform obliterate with left radial compression. Doppler waveform obliterate with left ulnar compression.   Electronically signed by Lemar Livings MD on 08/06/2020 at 4:54:32 PM.    Final     Cardiac Studies   As above  Patient Profile     54 y.o. male with multivessel CAD, smoker/prediabetes, presenting with elevated cardiac enzymes and severely depressed LVEF. Has had recurrent syncope.  Assessment & Plan    1. CAD s/p recent NSTEMI:  For multivessel CABG tomorrow. Discussed typical recovery, possible complications. With recent MI, consider clopidogrel at DC. 2. CHF: severely depressed LVEF, but well compensated, appears clinically euvolemic. On Farxiga and low dose carvedilol. Entresto stopped due to low BP. 3. Hx of syncope: may have been related to hypotension during titration of HF meds, but arrhythmia is a possibility.  4. Smoking: now day 3 since last cigarette, he will try not to restart smoking.  For questions or updates, please contact [facility] Please consult [email or web address] for contact info under        Signed, Thurmon Fair, MD  08/08/2020, 9:30 AM

## 2020-08-08 NOTE — Progress Notes (Signed)
ANTICOAGULATION CONSULT NOTE   Pharmacy Consult for Heparin Indication: chest pain/ACS, awaiting CABG  Allergies  Allergen Reactions  . Entresto [Sacubitril-Valsartan] Other (See Comments)    Severe weakness, fatigue, ? Contributed to syncopal episode    Patient Measurements: Height: 5\' 9"  (175.3 cm) Weight: 80.1 kg (176 lb 9.4 oz) IBW/kg (Calculated) : 70.7 Heparin Dosing Weight:  83 kg  Vital Signs: Temp: 98.6 F (37 C) (08/28 2331) Temp Source: Oral (08/28 2331) BP: 128/86 (08/28 2331) Pulse Rate: 85 (08/28 2331)  Labs: Recent Labs    08/05/20 1458 08/05/20 1458 08/05/20 1701 08/05/20 2237 08/06/20 0639 08/06/20 2138 08/07/20 0219 08/08/20 0050  HGB 14.9   < >  --   --   --   --  14.4 13.3  HCT 44.2  --   --   --   --   --  43.5 40.5  PLT 324  --   --   --   --   --  282 264  LABPROT  --   --   --   --   --   --  12.7  --   INR  --   --   --   --   --   --  1.0  --   HEPARINUNFRC  --   --   --    < > 0.18* <0.10*  --  0.25*  CREATININE 0.89  --   --   --  1.00  --  0.86  --   TROPONINIHS 9,076*  --  08/10/20*  --   --   --   --   --    < > = values in this interval not displayed.    Estimated Creatinine Clearance: 99.3 mL/min (by C-G formula based on SCr of 0.86 mg/dL).   Medical History: Past Medical History:  Diagnosis Date  . Coronary artery disease   . GERD (gastroesophageal reflux disease)   . Heart attack (HCC) 12/1998    Assessment: 3 yoM admitted with NSTEMI started on IV heparin. Pt s/p LHC with multivessel CAD, CVTS consulted for possible surgical revascularization.  Pharmacy to resume heparin in 8hr from sheath removal (~0800).  8/29 AM update:  Heparin level low No issues per RN  Goal of Therapy:  Heparin level 0.3-0.7 units/ml Monitor platelets by anticoagulation protocol: Yes   Plan:  Inc heparin to 1500 units//hr 1200 heparin level  9/29, PharmD, BCPS Clinical Pharmacist Phone: [phone]

## 2020-08-09 ENCOUNTER — Inpatient Hospital Stay (HOSPITAL_COMMUNITY): Payer: Commercial Managed Care - PPO | Admitting: Anesthesiology

## 2020-08-09 ENCOUNTER — Inpatient Hospital Stay (HOSPITAL_COMMUNITY)
Admission: EM | Disposition: A | Discharge status: Home / Self Care | Payer: Self-pay | Source: Ambulatory Visit | Attending: Cardiology

## 2020-08-09 ENCOUNTER — Inpatient Hospital Stay (HOSPITAL_COMMUNITY): Payer: Commercial Managed Care - PPO

## 2020-08-09 DIAGNOSIS — Z951 Presence of aortocoronary bypass graft: Secondary | ICD-10-CM

## 2020-08-09 HISTORY — DX: Presence of aortocoronary bypass graft: Z95.1

## 2020-08-09 HISTORY — PX: ENDOVEIN HARVEST OF GREATER SAPHENOUS VEIN: SHX5059

## 2020-08-09 HISTORY — PX: TEE WITHOUT CARDIOVERSION: SHX5443

## 2020-08-09 HISTORY — PX: CORONARY ARTERY BYPASS GRAFT: SHX141

## 2020-08-09 LAB — CBC
HCT: 42.9 % (ref 39.0–52.0)
HCT: 37.2 % — ABNORMAL LOW (ref 39.0–52.0)
HCT: 37.2 % — ABNORMAL LOW (ref 39.0–52.0)
Hemoglobin: 13.8 g/dL (ref 13.0–17.0)
Hemoglobin: 12.1 g/dL — ABNORMAL LOW (ref 13.0–17.0)
Hemoglobin: 12.2 g/dL — ABNORMAL LOW (ref 13.0–17.0)
MCH: 30.9 pg (ref 26.0–34.0)
MCH: 31.3 pg (ref 26.0–34.0)
MCH: 31.9 pg (ref 26.0–34.0)
MCHC: 32.2 g/dL (ref 30.0–36.0)
MCHC: 32.5 g/dL (ref 30.0–36.0)
MCHC: 32.8 g/dL (ref 30.0–36.0)
MCV: 96.2 fL (ref 80.0–100.0)
MCV: 96.4 fL (ref 80.0–100.0)
MCV: 97.1 fL (ref 80.0–100.0)
Platelets: 280 10*3/uL (ref 150–400)
Platelets: 182 10*3/uL (ref 150–400)
Platelets: 232 10*3/uL (ref 150–400)
RBC: 4.46 MIL/uL (ref 4.22–5.81)
RBC: 3.86 MIL/uL — ABNORMAL LOW (ref 4.22–5.81)
RBC: 3.83 MIL/uL — ABNORMAL LOW (ref 4.22–5.81)
RDW: 13.2 % (ref 11.5–15.5)
RDW: 13 % (ref 11.5–15.5)
RDW: 13 % (ref 11.5–15.5)
WBC: 9.1 10*3/uL (ref 4.0–10.5)
WBC: 13.8 10*3/uL — ABNORMAL HIGH (ref 4.0–10.5)
WBC: 16 10*3/uL — ABNORMAL HIGH (ref 4.0–10.5)
nRBC: 0 % (ref 0.0–0.2)
nRBC: 0 % (ref 0.0–0.2)
nRBC: 0 % (ref 0.0–0.2)

## 2020-08-09 LAB — POCT I-STAT 7, (LYTES, BLD GAS, ICA,H+H)
Acid-Base Excess: 0 mmol/L (ref 0.0–2.0)
Acid-Base Excess: 2 mmol/L (ref 0.0–2.0)
Acid-Base Excess: 2 mmol/L (ref 0.0–2.0)
Acid-Base Excess: 1 mmol/L (ref 0.0–2.0)
Acid-Base Excess: 0 mmol/L (ref 0.0–2.0)
Acid-base deficit: 3 mmol/L — ABNORMAL HIGH (ref 0.0–2.0)
Acid-base deficit: 4 mmol/L — ABNORMAL HIGH (ref 0.0–2.0)
Acid-base deficit: 4 mmol/L — ABNORMAL HIGH (ref 0.0–2.0)
Bicarbonate: 26.9 mmol/L (ref 20.0–28.0)
Bicarbonate: 27.7 mmol/L (ref 20.0–28.0)
Bicarbonate: 26.8 mmol/L (ref 20.0–28.0)
Bicarbonate: 26.7 mmol/L (ref 20.0–28.0)
Bicarbonate: 25.8 mmol/L (ref 20.0–28.0)
Bicarbonate: 23.5 mmol/L (ref 20.0–28.0)
Bicarbonate: 22.2 mmol/L (ref 20.0–28.0)
Bicarbonate: 21.1 mmol/L (ref 20.0–28.0)
Calcium, Ion: 1.24 mmol/L (ref 1.15–1.40)
Calcium, Ion: 0.96 mmol/L — ABNORMAL LOW (ref 1.15–1.40)
Calcium, Ion: 1.06 mmol/L — ABNORMAL LOW (ref 1.15–1.40)
Calcium, Ion: 1.04 mmol/L — ABNORMAL LOW (ref 1.15–1.40)
Calcium, Ion: 1.2 mmol/L (ref 1.15–1.40)
Calcium, Ion: 1.16 mmol/L (ref 1.15–1.40)
Calcium, Ion: 1.16 mmol/L (ref 1.15–1.40)
Calcium, Ion: 1.16 mmol/L (ref 1.15–1.40)
HCT: 41 % (ref 39.0–52.0)
HCT: 33 % — ABNORMAL LOW (ref 39.0–52.0)
HCT: 33 % — ABNORMAL LOW (ref 39.0–52.0)
HCT: 31 % — ABNORMAL LOW (ref 39.0–52.0)
HCT: 34 % — ABNORMAL LOW (ref 39.0–52.0)
HCT: 35 % — ABNORMAL LOW (ref 39.0–52.0)
HCT: 35 % — ABNORMAL LOW (ref 39.0–52.0)
HCT: 36 % — ABNORMAL LOW (ref 39.0–52.0)
Hemoglobin: 13.9 g/dL (ref 13.0–17.0)
Hemoglobin: 11.2 g/dL — ABNORMAL LOW (ref 13.0–17.0)
Hemoglobin: 11.2 g/dL — ABNORMAL LOW (ref 13.0–17.0)
Hemoglobin: 10.5 g/dL — ABNORMAL LOW (ref 13.0–17.0)
Hemoglobin: 11.6 g/dL — ABNORMAL LOW (ref 13.0–17.0)
Hemoglobin: 11.9 g/dL — ABNORMAL LOW (ref 13.0–17.0)
Hemoglobin: 11.9 g/dL — ABNORMAL LOW (ref 13.0–17.0)
Hemoglobin: 12.2 g/dL — ABNORMAL LOW (ref 13.0–17.0)
O2 Saturation: 100 %
O2 Saturation: 100 %
O2 Saturation: 100 %
O2 Saturation: 100 %
O2 Saturation: 100 %
O2 Saturation: 97 %
O2 Saturation: 98 %
O2 Saturation: 97 %
Patient temperature: 35.7
Patient temperature: 36.8
Patient temperature: 37.3
Potassium: 4.2 mmol/L (ref 3.5–5.1)
Potassium: 4.5 mmol/L (ref 3.5–5.1)
Potassium: 5.1 mmol/L (ref 3.5–5.1)
Potassium: 5.2 mmol/L — ABNORMAL HIGH (ref 3.5–5.1)
Potassium: 4.3 mmol/L (ref 3.5–5.1)
Potassium: 3.7 mmol/L (ref 3.5–5.1)
Potassium: 4.7 mmol/L (ref 3.5–5.1)
Potassium: 4.7 mmol/L (ref 3.5–5.1)
Sodium: 142 mmol/L (ref 135–145)
Sodium: 142 mmol/L (ref 135–145)
Sodium: 138 mmol/L (ref 135–145)
Sodium: 138 mmol/L (ref 135–145)
Sodium: 141 mmol/L (ref 135–145)
Sodium: 143 mmol/L (ref 135–145)
Sodium: 142 mmol/L (ref 135–145)
Sodium: 141 mmol/L (ref 135–145)
TCO2: 28 mmol/L (ref 22–32)
TCO2: 29 mmol/L (ref 22–32)
TCO2: 28 mmol/L (ref 22–32)
TCO2: 28 mmol/L (ref 22–32)
TCO2: 27 mmol/L (ref 22–32)
TCO2: 25 mmol/L (ref 22–32)
TCO2: 24 mmol/L (ref 22–32)
TCO2: 22 mmol/L (ref 22–32)
pCO2 arterial: 51 mmHg — ABNORMAL HIGH (ref 32.0–48.0)
pCO2 arterial: 46.9 mmHg (ref 32.0–48.0)
pCO2 arterial: 40.1 mmHg (ref 32.0–48.0)
pCO2 arterial: 45.2 mmHg (ref 32.0–48.0)
pCO2 arterial: 45.5 mmHg (ref 32.0–48.0)
pCO2 arterial: 43 mmHg (ref 32.0–48.0)
pCO2 arterial: 43 mmHg (ref 32.0–48.0)
pCO2 arterial: 40.1 mmHg (ref 32.0–48.0)
pH, Arterial: 7.33 — ABNORMAL LOW (ref 7.350–7.450)
pH, Arterial: 7.379 (ref 7.350–7.450)
pH, Arterial: 7.433 (ref 7.350–7.450)
pH, Arterial: 7.38 (ref 7.350–7.450)
pH, Arterial: 7.362 (ref 7.350–7.450)
pH, Arterial: 7.34 — ABNORMAL LOW (ref 7.350–7.450)
pH, Arterial: 7.32 — ABNORMAL LOW (ref 7.350–7.450)
pH, Arterial: 7.331 — ABNORMAL LOW (ref 7.350–7.450)
pO2, Arterial: 403 mmHg — ABNORMAL HIGH (ref 83.0–108.0)
pO2, Arterial: 389 mmHg — ABNORMAL HIGH (ref 83.0–108.0)
pO2, Arterial: 396 mmHg — ABNORMAL HIGH (ref 83.0–108.0)
pO2, Arterial: 350 mmHg — ABNORMAL HIGH (ref 83.0–108.0)
pO2, Arterial: 411 mmHg — ABNORMAL HIGH (ref 83.0–108.0)
pO2, Arterial: 90 mmHg (ref 83.0–108.0)
pO2, Arterial: 110 mmHg — ABNORMAL HIGH (ref 83.0–108.0)
pO2, Arterial: 95 mmHg (ref 83.0–108.0)

## 2020-08-09 LAB — POCT I-STAT, CHEM 8
BUN: 8 mg/dL (ref 6–20)
BUN: 8 mg/dL (ref 6–20)
BUN: 8 mg/dL (ref 6–20)
BUN: 8 mg/dL (ref 6–20)
BUN: 7 mg/dL (ref 6–20)
Calcium, Ion: 1.22 mmol/L (ref 1.15–1.40)
Calcium, Ion: 1.03 mmol/L — ABNORMAL LOW (ref 1.15–1.40)
Calcium, Ion: 1.21 mmol/L (ref 1.15–1.40)
Calcium, Ion: 1.03 mmol/L — ABNORMAL LOW (ref 1.15–1.40)
Calcium, Ion: 1.22 mmol/L (ref 1.15–1.40)
Chloride: 104 mmol/L (ref 98–111)
Chloride: 103 mmol/L (ref 98–111)
Chloride: 105 mmol/L (ref 98–111)
Chloride: 104 mmol/L (ref 98–111)
Chloride: 104 mmol/L (ref 98–111)
Creatinine, Ser: 0.7 mg/dL (ref 0.61–1.24)
Creatinine, Ser: 0.6 mg/dL — ABNORMAL LOW (ref 0.61–1.24)
Creatinine, Ser: 0.7 mg/dL (ref 0.61–1.24)
Creatinine, Ser: 0.6 mg/dL — ABNORMAL LOW (ref 0.61–1.24)
Creatinine, Ser: 0.6 mg/dL — ABNORMAL LOW (ref 0.61–1.24)
Glucose, Bld: 104 mg/dL — ABNORMAL HIGH (ref 70–99)
Glucose, Bld: 121 mg/dL — ABNORMAL HIGH (ref 70–99)
Glucose, Bld: 124 mg/dL — ABNORMAL HIGH (ref 70–99)
Glucose, Bld: 131 mg/dL — ABNORMAL HIGH (ref 70–99)
Glucose, Bld: 139 mg/dL — ABNORMAL HIGH (ref 70–99)
HCT: 41 % (ref 39.0–52.0)
HCT: 32 % — ABNORMAL LOW (ref 39.0–52.0)
HCT: 40 % (ref 39.0–52.0)
HCT: 31 % — ABNORMAL LOW (ref 39.0–52.0)
HCT: 33 % — ABNORMAL LOW (ref 39.0–52.0)
Hemoglobin: 13.9 g/dL (ref 13.0–17.0)
Hemoglobin: 10.9 g/dL — ABNORMAL LOW (ref 13.0–17.0)
Hemoglobin: 13.6 g/dL (ref 13.0–17.0)
Hemoglobin: 10.5 g/dL — ABNORMAL LOW (ref 13.0–17.0)
Hemoglobin: 11.2 g/dL — ABNORMAL LOW (ref 13.0–17.0)
Potassium: 4.3 mmol/L (ref 3.5–5.1)
Potassium: 4.6 mmol/L (ref 3.5–5.1)
Potassium: 5.2 mmol/L — ABNORMAL HIGH (ref 3.5–5.1)
Potassium: 5.5 mmol/L — ABNORMAL HIGH (ref 3.5–5.1)
Potassium: 4.4 mmol/L (ref 3.5–5.1)
Sodium: 142 mmol/L (ref 135–145)
Sodium: 137 mmol/L (ref 135–145)
Sodium: 139 mmol/L (ref 135–145)
Sodium: 137 mmol/L (ref 135–145)
Sodium: 140 mmol/L (ref 135–145)
TCO2: 26 mmol/L (ref 22–32)
TCO2: 25 mmol/L (ref 22–32)
TCO2: 25 mmol/L (ref 22–32)
TCO2: 24 mmol/L (ref 22–32)
TCO2: 28 mmol/L (ref 22–32)

## 2020-08-09 LAB — HEMOGLOBIN AND HEMATOCRIT, BLOOD
HCT: 33.5 % — ABNORMAL LOW (ref 39.0–52.0)
Hemoglobin: 10.9 g/dL — ABNORMAL LOW (ref 13.0–17.0)

## 2020-08-09 LAB — BASIC METABOLIC PANEL
Anion gap: 8 (ref 5–15)
Anion gap: 8 (ref 5–15)
BUN: 9 mg/dL (ref 6–20)
BUN: 9 mg/dL (ref 6–20)
CO2: 26 mmol/L (ref 22–32)
CO2: 20 mmol/L — ABNORMAL LOW (ref 22–32)
Calcium: 8.5 mg/dL — ABNORMAL LOW (ref 8.9–10.3)
Calcium: 8 mg/dL — ABNORMAL LOW (ref 8.9–10.3)
Chloride: 104 mmol/L (ref 98–111)
Chloride: 110 mmol/L (ref 98–111)
Creatinine, Ser: 0.89 mg/dL (ref 0.61–1.24)
Creatinine, Ser: 0.95 mg/dL (ref 0.61–1.24)
GFR calc Af Amer: >60 mL/min (ref >60)
GFR calc Af Amer: >60 mL/min (ref >60)
GFR calc non Af Amer: >60 mL/min (ref >60)
GFR calc non Af Amer: >60 mL/min (ref >60)
Glucose, Bld: 94 mg/dL (ref 70–99)
Glucose, Bld: 145 mg/dL — ABNORMAL HIGH (ref 70–99)
Potassium: 3.8 mmol/L (ref 3.5–5.1)
Potassium: 4.8 mmol/L (ref 3.5–5.1)
Sodium: 138 mmol/L (ref 135–145)
Sodium: 138 mmol/L (ref 135–145)

## 2020-08-09 LAB — PROTIME-INR
INR: 1.4 — ABNORMAL HIGH (ref 0.8–1.2)
Prothrombin Time: 16.4 seconds — ABNORMAL HIGH (ref 11.4–15.2)

## 2020-08-09 LAB — GLUCOSE, CAPILLARY
Glucose-Capillary: 127 mg/dL — ABNORMAL HIGH (ref 70–99)
Glucose-Capillary: 126 mg/dL — ABNORMAL HIGH (ref 70–99)
Glucose-Capillary: 113 mg/dL — ABNORMAL HIGH (ref 70–99)
Glucose-Capillary: 117 mg/dL — ABNORMAL HIGH (ref 70–99)
Glucose-Capillary: 111 mg/dL — ABNORMAL HIGH (ref 70–99)
Glucose-Capillary: 170 mg/dL — ABNORMAL HIGH (ref 70–99)

## 2020-08-09 LAB — MAGNESIUM: Magnesium: 2.7 mg/dL — ABNORMAL HIGH (ref 1.7–2.4)

## 2020-08-09 LAB — APTT: aPTT: 35 seconds (ref 24–36)

## 2020-08-09 LAB — PLATELET COUNT: Platelets: 201 10*3/uL (ref 150–400)

## 2020-08-09 SURGERY — CORONARY ARTERY BYPASS GRAFTING (CABG)
Anesthesia: General | Site: Leg Upper | Laterality: Right

## 2020-08-09 MED ORDER — PHENYLEPHRINE HCL-NACL 20-0.9 MG/250ML-% IV SOLN: 0.0000 ug/min | INTRAVENOUS | Status: DC

## 2020-08-09 MED ORDER — SODIUM CHLORIDE 0.9 % IV SOLN: INTRAVENOUS | Status: DC | PRN

## 2020-08-09 MED ORDER — LACTATED RINGERS IV SOLN: INTRAVENOUS | Status: DC

## 2020-08-09 MED ORDER — ETOMIDATE 2 MG/ML IV SOLN
INTRAVENOUS | Status: AC
  Filled 2020-08-09: qty 10

## 2020-08-09 MED ORDER — EPINEPHRINE HCL 5 MG/250ML IV SOLN IN NS: 0.0000 ug/min | INTRAVENOUS | Status: DC

## 2020-08-09 MED ORDER — DOCUSATE SODIUM 100 MG PO CAPS
200.0000 mg | ORAL_CAPSULE | Freq: Every day | ORAL | Status: DC
  Administered 2020-08-10 – 2020-08-13 (×3): 200 mg via ORAL
  Filled 2020-08-09 (×3): qty 2

## 2020-08-09 MED ORDER — METOPROLOL TARTRATE 25 MG/10 ML ORAL SUSPENSION: 12.5000 mg | Freq: Two times a day (BID) | ORAL | Status: DC

## 2020-08-09 MED ORDER — EPHEDRINE 5 MG/ML INJ
INTRAVENOUS | Status: AC
  Filled 2020-08-09: qty 10

## 2020-08-09 MED ORDER — MIDAZOLAM HCL 2 MG/2ML IJ SOLN: 2.0000 mg | INTRAMUSCULAR | Status: DC | PRN

## 2020-08-09 MED ORDER — ETOMIDATE 2 MG/ML IV SOLN
INTRAVENOUS | Status: DC | PRN
  Administered 2020-08-09: 18 mg via INTRAVENOUS

## 2020-08-09 MED ORDER — CHLORHEXIDINE GLUCONATE CLOTH 2 % EX PADS
6.0000 | MEDICATED_PAD | Freq: Every day | CUTANEOUS | Status: DC
  Administered 2020-08-09 – 2020-08-12 (×4): 6 via TOPICAL

## 2020-08-09 MED ORDER — SODIUM CHLORIDE 0.45 % IV SOLN: INTRAVENOUS | Status: DC | PRN

## 2020-08-09 MED ORDER — METOPROLOL TARTRATE 12.5 MG HALF TABLET: 12.5000 mg | ORAL_TABLET | Freq: Two times a day (BID) | ORAL | Status: DC

## 2020-08-09 MED ORDER — FENTANYL CITRATE (PF) 100 MCG/2ML IJ SOLN
INTRAMUSCULAR | Status: DC | PRN
Start: 2020-08-09 — End: 2020-08-09
  Administered 2020-08-09: 100 ug via INTRAVENOUS
  Administered 2020-08-09: 150 ug via INTRAVENOUS
  Administered 2020-08-09: 100 ug via INTRAVENOUS
  Administered 2020-08-09: 50 ug via INTRAVENOUS
  Administered 2020-08-09: 100 ug via INTRAVENOUS
  Administered 2020-08-09: 50 ug via INTRAVENOUS
  Administered 2020-08-09: 100 ug via INTRAVENOUS
  Administered 2020-08-09: 50 ug via INTRAVENOUS
  Administered 2020-08-09: 100 ug via INTRAVENOUS

## 2020-08-09 MED ORDER — CHLORHEXIDINE GLUCONATE 0.12 % MT SOLN
15.0000 mL | OROMUCOSAL | Status: AC
  Administered 2020-08-09: 15 mL via OROMUCOSAL

## 2020-08-09 MED ORDER — LACTATED RINGERS IV SOLN: 500.0000 mL | Freq: Once | INTRAVENOUS | Status: DC | PRN

## 2020-08-09 MED ORDER — MORPHINE SULFATE (PF) 2 MG/ML IV SOLN
1.0000 mg | INTRAVENOUS | Status: DC | PRN
  Administered 2020-08-09 – 2020-08-10 (×3): 2 mg via INTRAVENOUS
  Filled 2020-08-09 (×3): qty 1

## 2020-08-09 MED ORDER — LACTATED RINGERS IV SOLN: INTRAVENOUS | Status: DC | PRN

## 2020-08-09 MED ORDER — ROCURONIUM BROMIDE 10 MG/ML (PF) SYRINGE
PREFILLED_SYRINGE | INTRAVENOUS | Status: DC | PRN
  Administered 2020-08-09: 50 mg via INTRAVENOUS
  Administered 2020-08-09: 100 mg via INTRAVENOUS
  Administered 2020-08-09: 30 mg via INTRAVENOUS

## 2020-08-09 MED ORDER — PLASMA-LYTE 148 IV SOLN
INTRAVENOUS | Status: DC | PRN
  Administered 2020-08-09: 500 mL via INTRAVASCULAR

## 2020-08-09 MED ORDER — EPHEDRINE SULFATE-NACL 50-0.9 MG/10ML-% IV SOSY
PREFILLED_SYRINGE | INTRAVENOUS | Status: DC | PRN
  Administered 2020-08-09 (×2): 5 mg via INTRAVENOUS

## 2020-08-09 MED ORDER — SODIUM CHLORIDE 0.9 % IV SOLN: 250.0000 mL | INTRAVENOUS | Status: DC

## 2020-08-09 MED ORDER — DEXMEDETOMIDINE HCL IN NACL 400 MCG/100ML IV SOLN: 0.0000 ug/kg/h | INTRAVENOUS | Status: DC

## 2020-08-09 MED ORDER — NOREPINEPHRINE 16 MG/250ML-% IV SOLN
0.0000 ug/min | INTRAVENOUS | Status: DC
  Administered 2020-08-09: 10 ug/min via INTRAVENOUS
  Filled 2020-08-09: qty 250

## 2020-08-09 MED ORDER — SODIUM CHLORIDE 0.9% FLUSH: 3.0000 mL | INTRAVENOUS | Status: DC | PRN

## 2020-08-09 MED ORDER — ASPIRIN 81 MG PO CHEW
324.0000 mg | CHEWABLE_TABLET | Freq: Every day | ORAL | Status: DC
  Filled 2020-08-09: qty 4

## 2020-08-09 MED ORDER — FENTANYL CITRATE (PF) 250 MCG/5ML IJ SOLN
INTRAMUSCULAR | Status: AC
  Filled 2020-08-09: qty 25

## 2020-08-09 MED ORDER — OXYCODONE HCL 5 MG PO TABS
5.0000 mg | ORAL_TABLET | ORAL | Status: DC | PRN
  Administered 2020-08-09 – 2020-08-10 (×5): 10 mg via ORAL
  Filled 2020-08-09 (×5): qty 2

## 2020-08-09 MED ORDER — NITROGLYCERIN IN D5W 200-5 MCG/ML-% IV SOLN: 0.0000 ug/min | INTRAVENOUS | Status: DC

## 2020-08-09 MED ORDER — PROTAMINE SULFATE 10 MG/ML IV SOLN
INTRAVENOUS | Status: AC
  Filled 2020-08-09: qty 25

## 2020-08-09 MED ORDER — MOMETASONE FURO-FORMOTEROL FUM 200-5 MCG/ACT IN AERO
2.0000 | INHALATION_SPRAY | Freq: Two times a day (BID) | RESPIRATORY_TRACT | Status: DC
  Filled 2020-08-09: qty 8.8

## 2020-08-09 MED ORDER — INSULIN REGULAR(HUMAN) IN NACL 100-0.9 UT/100ML-% IV SOLN: INTRAVENOUS | Status: DC

## 2020-08-09 MED ORDER — ONDANSETRON HCL 4 MG/2ML IJ SOLN: 4.0000 mg | Freq: Four times a day (QID) | INTRAMUSCULAR | Status: DC | PRN

## 2020-08-09 MED ORDER — LIDOCAINE 2% (20 MG/ML) 5 ML SYRINGE
INTRAMUSCULAR | Status: AC
  Filled 2020-08-09: qty 5

## 2020-08-09 MED ORDER — POTASSIUM CHLORIDE 10 MEQ/50ML IV SOLN
10.0000 meq | INTRAVENOUS | Status: AC
  Administered 2020-08-09 (×3): 10 meq via INTRAVENOUS

## 2020-08-09 MED ORDER — ACETAMINOPHEN 160 MG/5ML PO SOLN: 1000.0000 mg | Freq: Four times a day (QID) | ORAL | Status: DC

## 2020-08-09 MED ORDER — SODIUM CHLORIDE 0.9% FLUSH: 10.0000 mL | INTRAVENOUS | Status: DC | PRN

## 2020-08-09 MED ORDER — MAGNESIUM SULFATE 4 GM/100ML IV SOLN
INTRAVENOUS | Status: AC
  Administered 2020-08-09: 4 g via INTRAVENOUS
  Filled 2020-08-09: qty 100

## 2020-08-09 MED ORDER — BISACODYL 10 MG RE SUPP: 10.0000 mg | Freq: Every day | RECTAL | Status: DC

## 2020-08-09 MED ORDER — ALBUMIN HUMAN 5 % IV SOLN: INTRAVENOUS | Status: DC | PRN

## 2020-08-09 MED ORDER — ACETAMINOPHEN 160 MG/5ML PO SOLN: 650.0000 mg | Freq: Once | ORAL | Status: AC

## 2020-08-09 MED ORDER — HEMOSTATIC AGENTS (NO CHARGE) OPTIME
TOPICAL | Status: DC | PRN
  Administered 2020-08-09 (×5): 1 via TOPICAL

## 2020-08-09 MED ORDER — ARTIFICIAL TEARS OPHTHALMIC OINT
TOPICAL_OINTMENT | OPHTHALMIC | Status: DC | PRN
  Administered 2020-08-09: 1 via OPHTHALMIC

## 2020-08-09 MED ORDER — FAMOTIDINE IN NACL 20-0.9 MG/50ML-% IV SOLN
20.0000 mg | Freq: Two times a day (BID) | INTRAVENOUS | Status: AC
  Administered 2020-08-09: 20 mg via INTRAVENOUS
  Filled 2020-08-09: qty 50

## 2020-08-09 MED ORDER — PHENYLEPHRINE HCL-NACL 10-0.9 MG/250ML-% IV SOLN
INTRAVENOUS | Status: DC | PRN
  Administered 2020-08-09: 20 ug/min via INTRAVENOUS

## 2020-08-09 MED ORDER — ASPIRIN EC 325 MG PO TBEC
325.0000 mg | DELAYED_RELEASE_TABLET | Freq: Every day | ORAL | Status: DC
  Administered 2020-08-10 – 2020-08-13 (×4): 325 mg via ORAL
  Filled 2020-08-09 (×4): qty 1

## 2020-08-09 MED ORDER — PHENYLEPHRINE 40 MCG/ML (10ML) SYRINGE FOR IV PUSH (FOR BLOOD PRESSURE SUPPORT)
PREFILLED_SYRINGE | INTRAVENOUS | Status: AC
  Filled 2020-08-09: qty 10

## 2020-08-09 MED ORDER — SUCCINYLCHOLINE CHLORIDE 20 MG/ML IJ SOLN
INTRAMUSCULAR | Status: DC | PRN
  Administered 2020-08-09: 120 mg via INTRAVENOUS

## 2020-08-09 MED ORDER — TRAMADOL HCL 50 MG PO TABS
50.0000 mg | ORAL_TABLET | ORAL | Status: DC | PRN
  Administered 2020-08-11: 100 mg via ORAL
  Filled 2020-08-09: qty 2

## 2020-08-09 MED ORDER — METOPROLOL TARTRATE 5 MG/5ML IV SOLN: 2.5000 mg | INTRAVENOUS | Status: DC | PRN

## 2020-08-09 MED ORDER — ALBUMIN HUMAN 5 % IV SOLN
250.0000 mL | INTRAVENOUS | Status: AC | PRN
  Administered 2020-08-09 (×2): 12.5 g via INTRAVENOUS

## 2020-08-09 MED ORDER — SODIUM CHLORIDE 0.9 % IV SOLN: INTRAVENOUS | Status: DC

## 2020-08-09 MED ORDER — PANTOPRAZOLE SODIUM 40 MG PO TBEC
40.0000 mg | DELAYED_RELEASE_TABLET | Freq: Every day | ORAL | Status: DC
  Administered 2020-08-11 – 2020-08-13 (×3): 40 mg via ORAL
  Filled 2020-08-09 (×3): qty 1

## 2020-08-09 MED ORDER — SODIUM CHLORIDE 0.9 % IV SOLN
1.5000 g | Freq: Two times a day (BID) | INTRAVENOUS | Status: AC
  Administered 2020-08-09 – 2020-08-11 (×4): 1.5 g via INTRAVENOUS
  Filled 2020-08-09 (×4): qty 1.5

## 2020-08-09 MED ORDER — BISACODYL 5 MG PO TBEC
10.0000 mg | DELAYED_RELEASE_TABLET | Freq: Every day | ORAL | Status: DC
  Administered 2020-08-10 – 2020-08-11 (×2): 10 mg via ORAL
  Filled 2020-08-09 (×3): qty 2

## 2020-08-09 MED ORDER — INSULIN ASPART 100 UNIT/ML ~~LOC~~ SOLN
0.0000 [IU] | SUBCUTANEOUS | Status: DC
  Administered 2020-08-09: 4 [IU] via SUBCUTANEOUS
  Administered 2020-08-10 (×3): 2 [IU] via SUBCUTANEOUS

## 2020-08-09 MED ORDER — ROCURONIUM BROMIDE 10 MG/ML (PF) SYRINGE
PREFILLED_SYRINGE | INTRAVENOUS | Status: AC
  Filled 2020-08-09: qty 20

## 2020-08-09 MED ORDER — PROTAMINE SULFATE 10 MG/ML IV SOLN
INTRAVENOUS | Status: AC
  Filled 2020-08-09: qty 5

## 2020-08-09 MED ORDER — FAMOTIDINE IN NACL 20-0.9 MG/50ML-% IV SOLN
INTRAVENOUS | Status: AC
  Administered 2020-08-09: 20 mg via INTRAVENOUS
  Filled 2020-08-09: qty 50

## 2020-08-09 MED ORDER — ACETAMINOPHEN 500 MG PO TABS
1000.0000 mg | ORAL_TABLET | Freq: Four times a day (QID) | ORAL | Status: DC
  Administered 2020-08-09 – 2020-08-13 (×15): 1000 mg via ORAL
  Filled 2020-08-09 (×15): qty 2

## 2020-08-09 MED ORDER — METOCLOPRAMIDE HCL 5 MG/ML IJ SOLN
10.0000 mg | Freq: Four times a day (QID) | INTRAMUSCULAR | Status: AC
  Administered 2020-08-09 – 2020-08-12 (×11): 10 mg via INTRAVENOUS
  Filled 2020-08-09 (×11): qty 2

## 2020-08-09 MED ORDER — VANCOMYCIN HCL IN DEXTROSE 1-5 GM/200ML-% IV SOLN
1000.0000 mg | Freq: Once | INTRAVENOUS | Status: AC
  Administered 2020-08-09: 1000 mg via INTRAVENOUS
  Filled 2020-08-09: qty 200

## 2020-08-09 MED ORDER — DEXTROSE 50 % IV SOLN: 0.0000 mL | INTRAVENOUS | Status: DC | PRN

## 2020-08-09 MED ORDER — HEPARIN SODIUM (PORCINE) 1000 UNIT/ML IJ SOLN
INTRAMUSCULAR | Status: DC | PRN
  Administered 2020-08-09: 3000 [IU] via INTRAVENOUS
  Administered 2020-08-09: 26000 [IU] via INTRAVENOUS

## 2020-08-09 MED ORDER — 0.9 % SODIUM CHLORIDE (POUR BTL) OPTIME
TOPICAL | Status: DC | PRN
  Administered 2020-08-09: 5000 mL

## 2020-08-09 MED ORDER — MAGNESIUM SULFATE 4 GM/100ML IV SOLN: 4.0000 g | Freq: Once | INTRAVENOUS | Status: AC

## 2020-08-09 MED ORDER — HEPARIN SODIUM (PORCINE) 1000 UNIT/ML IJ SOLN
INTRAMUSCULAR | Status: AC
  Filled 2020-08-09: qty 1

## 2020-08-09 MED ORDER — SODIUM CHLORIDE 0.9% FLUSH
3.0000 mL | Freq: Two times a day (BID) | INTRAVENOUS | Status: DC
  Administered 2020-08-10 – 2020-08-13 (×6): 3 mL via INTRAVENOUS

## 2020-08-09 MED ORDER — SUCCINYLCHOLINE CHLORIDE 200 MG/10ML IV SOSY
PREFILLED_SYRINGE | INTRAVENOUS | Status: AC
  Filled 2020-08-09: qty 10

## 2020-08-09 MED ORDER — MIDAZOLAM HCL (PF) 5 MG/ML IJ SOLN
INTRAMUSCULAR | Status: DC | PRN
  Administered 2020-08-09: 1 mg via INTRAVENOUS
  Administered 2020-08-09: 2 mg via INTRAVENOUS
  Administered 2020-08-09: 3 mg via INTRAVENOUS
  Administered 2020-08-09 (×2): 2 mg via INTRAVENOUS

## 2020-08-09 MED ORDER — LIDOCAINE 2% (20 MG/ML) 5 ML SYRINGE
INTRAMUSCULAR | Status: DC | PRN
  Administered 2020-08-09: 100 mg via INTRAVENOUS

## 2020-08-09 MED ORDER — MIDAZOLAM HCL (PF) 10 MG/2ML IJ SOLN
INTRAMUSCULAR | Status: AC
  Filled 2020-08-09: qty 2

## 2020-08-09 MED ORDER — PROTAMINE SULFATE 10 MG/ML IV SOLN
INTRAVENOUS | Status: DC | PRN
  Administered 2020-08-09: 250 mg via INTRAVENOUS
  Administered 2020-08-09: 40 mg via INTRAVENOUS

## 2020-08-09 MED ORDER — MILRINONE LACTATE IN DEXTROSE 20-5 MG/100ML-% IV SOLN
0.2500 ug/kg/min | INTRAVENOUS | Status: DC
  Administered 2020-08-09: 0.3 ug/kg/min via INTRAVENOUS
  Administered 2020-08-10: 0.25 ug/kg/min via INTRAVENOUS
  Filled 2020-08-09 (×3): qty 100

## 2020-08-09 MED ORDER — SODIUM CHLORIDE 0.9% FLUSH
10.0000 mL | Freq: Two times a day (BID) | INTRAVENOUS | Status: DC
  Administered 2020-08-09 – 2020-08-10 (×3): 10 mL

## 2020-08-09 MED ORDER — PROPOFOL 10 MG/ML IV BOLUS
INTRAVENOUS | Status: AC
  Filled 2020-08-09: qty 20

## 2020-08-09 MED ORDER — ACETAMINOPHEN 650 MG RE SUPP
650.0000 mg | Freq: Once | RECTAL | Status: AC
  Administered 2020-08-09: 650 mg via RECTAL

## 2020-08-09 SURGICAL SUPPLY — 126 items
ADAPTER CARDIO PERF ANTE/RETRO (ADAPTER) ×5 IMPLANT
ADH SKN CLS APL DERMABOND .7 (GAUZE/BANDAGES/DRESSINGS) ×3
ADPR PRFSN 84XANTGRD RTRGD (ADAPTER) ×3
AGENT HMST KT MTR STRL THRMB (HEMOSTASIS) ×3
APL SRG 7X2 LUM MLBL SLNT (VASCULAR PRODUCTS)
APPLICATOR TIP COSEAL (VASCULAR PRODUCTS) IMPLANT
BAG DECANTER FOR FLEXI CONT (MISCELLANEOUS) ×5 IMPLANT
BLADE CLIPPER SURG (BLADE) ×5 IMPLANT
BLADE STERNUM SYSTEM 6 (BLADE) ×5 IMPLANT
BLADE SURG 11 STRL SS (BLADE) ×2 IMPLANT
BLADE SURG 12 STRL SS (BLADE) ×5 IMPLANT
BNDG ELASTIC 4X5.8 VLCR STR LF (GAUZE/BANDAGES/DRESSINGS) ×5 IMPLANT
BNDG ELASTIC 6X5.8 VLCR STR LF (GAUZE/BANDAGES/DRESSINGS) ×5 IMPLANT
BNDG GAUZE ELAST 4 BULKY (GAUZE/BANDAGES/DRESSINGS) ×5 IMPLANT
CANISTER SUCT 3000ML PPV (MISCELLANEOUS) ×5 IMPLANT
CANNULA GUNDRY RCSP 15FR (MISCELLANEOUS) ×5 IMPLANT
CATH CPB KIT VANTRIGT (MISCELLANEOUS) ×5 IMPLANT
CATH DIAG EXPO 6F AL1 (CATHETERS) ×3 IMPLANT
CATH INFINITI 6F MPB2 (CATHETERS) ×3 IMPLANT
CATH ROBINSON RED A/P 18FR (CATHETERS) ×15 IMPLANT
CATH THORACIC 28FR RT ANG (CATHETERS) ×7 IMPLANT
CLIP FOGARTY SPRING 6M (CLIP) ×2 IMPLANT
DEFOGGER ANTIFOG KIT (MISCELLANEOUS) ×2 IMPLANT
DERMABOND ADVANCED (GAUZE/BANDAGES/DRESSINGS) ×2
DERMABOND ADVANCED .7 DNX12 (GAUZE/BANDAGES/DRESSINGS) IMPLANT
DRAIN CHANNEL 32F RND 10.7 FF (WOUND CARE) ×5 IMPLANT
DRAPE C-ARM 42X72 X-RAY (DRAPES) ×6 IMPLANT
DRAPE CARDIOVASCULAR INCISE (DRAPES) ×5
DRAPE CV SPLIT W-CLR ANES SCRN (DRAPES) ×3 IMPLANT
DRAPE PERI GROIN 82X75IN TIB (DRAPES) ×3 IMPLANT
DRAPE SLUSH/WARMER DISC (DRAPES) ×5 IMPLANT
DRAPE SRG 135X102X78XABS (DRAPES) ×3 IMPLANT
DRSG AQUACEL AG ADV 3.5X14 (GAUZE/BANDAGES/DRESSINGS) ×5 IMPLANT
ELECT BLADE 4.0 EZ CLEAN MEGAD (MISCELLANEOUS) ×5
ELECT BLADE 6.5 EXT (BLADE) ×5 IMPLANT
ELECT CAUTERY BLADE 6.4 (BLADE) ×5 IMPLANT
ELECT REM PT RETURN 9FT ADLT (ELECTROSURGICAL) ×10
ELECTRODE BLDE 4.0 EZ CLN MEGD (MISCELLANEOUS) ×3 IMPLANT
ELECTRODE REM PT RTRN 9FT ADLT (ELECTROSURGICAL) ×6 IMPLANT
FELT TEFLON 1X6 (MISCELLANEOUS) ×8 IMPLANT
GAUZE SPONGE 4X4 12PLY STRL (GAUZE/BANDAGES/DRESSINGS) ×8 IMPLANT
GAUZE SPONGE 4X4 12PLY STRL LF (GAUZE/BANDAGES/DRESSINGS) ×2 IMPLANT
GLOVE BIO SURGEON STRL SZ 6 (GLOVE) ×4 IMPLANT
GLOVE BIO SURGEON STRL SZ 6.5 (GLOVE) ×1 IMPLANT
GLOVE BIO SURGEON STRL SZ7 (GLOVE) ×2 IMPLANT
GLOVE BIO SURGEON STRL SZ7.5 (GLOVE) ×17 IMPLANT
GLOVE BIO SURGEON STRL SZ8 (GLOVE) ×4 IMPLANT
GLOVE BIO SURGEONS STRL SZ 6.5 (GLOVE) ×1
GLOVE BIOGEL M STRL SZ7.5 (GLOVE) ×9 IMPLANT
GLOVE BIOGEL PI IND STRL 6 (GLOVE) IMPLANT
GLOVE BIOGEL PI IND STRL 7.5 (GLOVE) IMPLANT
GLOVE BIOGEL PI IND STRL 8 (GLOVE) IMPLANT
GLOVE BIOGEL PI INDICATOR 6 (GLOVE) ×4
GLOVE BIOGEL PI INDICATOR 7.5 (GLOVE) ×10
GLOVE BIOGEL PI INDICATOR 8 (GLOVE) ×2
GOWN STRL REUS W/ TWL LRG LVL3 (GOWN DISPOSABLE) ×12 IMPLANT
GOWN STRL REUS W/ TWL XL LVL3 (GOWN DISPOSABLE) IMPLANT
GOWN STRL REUS W/TWL LRG LVL3 (GOWN DISPOSABLE) ×40
GOWN STRL REUS W/TWL XL LVL3 (GOWN DISPOSABLE) ×15
HEMOSTAT POWDER SURGIFOAM 1G (HEMOSTASIS) ×15 IMPLANT
HEMOSTAT SURGICEL 2X14 (HEMOSTASIS) ×5 IMPLANT
INSERT FOGARTY SM (MISCELLANEOUS) ×3 IMPLANT
INSERT FOGARTY XLG (MISCELLANEOUS) IMPLANT
KIT BASIN OR (CUSTOM PROCEDURE TRAY) ×5 IMPLANT
KIT SUCTION CATH 14FR (SUCTIONS) ×5 IMPLANT
KIT TURNOVER KIT B (KITS) ×5 IMPLANT
KIT VASOVIEW HEMOPRO 2 VH 4000 (KITS) ×5 IMPLANT
LEAD PACING MYOCARDI (MISCELLANEOUS) ×7 IMPLANT
LOOP VESSEL MINI RED (MISCELLANEOUS) ×3 IMPLANT
MARKER GRAFT CORONARY BYPASS (MISCELLANEOUS) ×15 IMPLANT
NS IRRIG 1000ML POUR BTL (IV SOLUTION) ×25 IMPLANT
PACK CHEST (CUSTOM PROCEDURE TRAY) ×3 IMPLANT
PACK E OPEN HEART (SUTURE) ×5 IMPLANT
PACK OPEN HEART (CUSTOM PROCEDURE TRAY) ×5 IMPLANT
PAD ARMBOARD 7.5X6 YLW CONV (MISCELLANEOUS) ×10 IMPLANT
PAD ELECT DEFIB RADIOL ZOLL (MISCELLANEOUS) ×5 IMPLANT
PENCIL BUTTON HOLSTER BLD 10FT (ELECTRODE) ×5 IMPLANT
POSITIONER HEAD DONUT 9IN (MISCELLANEOUS) ×5 IMPLANT
PUMP SET IMPELLA 5.5 US (CATHETERS) ×5 IMPLANT
PUNCH AORTIC ROTATE 4.0MM (MISCELLANEOUS) IMPLANT
PUNCH AORTIC ROTATE 4.5MM 8IN (MISCELLANEOUS) ×2 IMPLANT
PUNCH AORTIC ROTATE 5MM 8IN (MISCELLANEOUS) IMPLANT
SEALANT SURG COSEAL 8ML (VASCULAR PRODUCTS) ×3 IMPLANT
SET CARDIOPLEGIA MPS 5001102 (MISCELLANEOUS) ×2 IMPLANT
SPONGE LAP 18X18 RF (DISPOSABLE) ×2 IMPLANT
SPONGE LAP 4X18 RFD (DISPOSABLE) ×2 IMPLANT
SUPPORT HEART JANKE-BARRON (MISCELLANEOUS) ×5 IMPLANT
SURGIFLO W/THROMBIN 8M KIT (HEMOSTASIS) ×5 IMPLANT
SUT BONE WAX W31G (SUTURE) ×5 IMPLANT
SUT ETHILON 3 0 PS 1 (SUTURE) ×6 IMPLANT
SUT MNCRL AB 4-0 PS2 18 (SUTURE) ×2 IMPLANT
SUT PROLENE 3 0 SH DA (SUTURE) IMPLANT
SUT PROLENE 3 0 SH1 36 (SUTURE) IMPLANT
SUT PROLENE 4 0 RB 1 (SUTURE) ×10
SUT PROLENE 4 0 SH DA (SUTURE) ×9 IMPLANT
SUT PROLENE 4-0 RB1 .5 CRCL 36 (SUTURE) ×3 IMPLANT
SUT PROLENE 5 0 C 1 36 (SUTURE) ×2 IMPLANT
SUT PROLENE 6 0 C 1 30 (SUTURE) ×12 IMPLANT
SUT PROLENE 6 0 CC (SUTURE) ×13 IMPLANT
SUT PROLENE 8 0 BV175 6 (SUTURE) IMPLANT
SUT PROLENE BLUE 7 0 (SUTURE) ×9 IMPLANT
SUT SILK  1 MH (SUTURE) ×15
SUT SILK 1 MH (SUTURE) ×12 IMPLANT
SUT SILK 1 TIES 10X30 (SUTURE) ×3 IMPLANT
SUT SILK 2 0 SH CR/8 (SUTURE) ×2 IMPLANT
SUT SILK 3 0 SH CR/8 (SUTURE) IMPLANT
SUT STEEL 6MS V (SUTURE) ×8 IMPLANT
SUT STEEL SZ 6 DBL 3X14 BALL (SUTURE) ×7 IMPLANT
SUT VIC AB 1 CTX 36 (SUTURE) ×10
SUT VIC AB 1 CTX36XBRD ANBCTR (SUTURE) ×6 IMPLANT
SUT VIC AB 2-0 CT1 27 (SUTURE) ×5
SUT VIC AB 2-0 CT1 TAPERPNT 27 (SUTURE) IMPLANT
SUT VIC AB 2-0 CTX 27 (SUTURE) IMPLANT
SUT VIC AB 3-0 X1 27 (SUTURE) IMPLANT
SYR BULB IRRIG 60ML STRL (SYRINGE) ×2 IMPLANT
SYSTEM SAHARA CHEST DRAIN ATS (WOUND CARE) ×5 IMPLANT
TAPE CLOTH SURG 4X10 WHT LF (GAUZE/BANDAGES/DRESSINGS) ×2 IMPLANT
TAPE PAPER 2X10 WHT MICROPORE (GAUZE/BANDAGES/DRESSINGS) ×2 IMPLANT
TOWEL GREEN STERILE (TOWEL DISPOSABLE) ×5 IMPLANT
TOWEL GREEN STERILE FF (TOWEL DISPOSABLE) ×5 IMPLANT
TRAY FOLEY SLVR 16FR TEMP STAT (SET/KITS/TRAYS/PACK) ×5 IMPLANT
TUBE CONNECTING 12'X1/4 (SUCTIONS) ×1
TUBE CONNECTING 12X1/4 (SUCTIONS) ×1 IMPLANT
TUBING LAP HI FLOW INSUFFLATIO (TUBING) ×5 IMPLANT
UNDERPAD 30X36 HEAVY ABSORB (UNDERPADS AND DIAPERS) ×5 IMPLANT
WATER STERILE IRR 1000ML POUR (IV SOLUTION) ×10 IMPLANT

## 2020-08-09 NOTE — Anesthesia Procedure Notes (Signed)
Arterial Line Insertion Start/End8/30/2021 6:56 AM Performed by: Rachel Moulds, CRNA  Patient location: Pre-op. Preanesthetic checklist: patient identified, IV checked, site marked, risks and benefits discussed, surgical consent, monitors and equipment checked, pre-op evaluation, timeout performed and anesthesia consent Lidocaine 1% used for infiltration Left, radial was placed Catheter size: 20 Fr Hand hygiene performed  and maximum sterile barriers used   Attempts: 1 Procedure performed without using ultrasound guided technique. Following insertion, dressing applied and Biopatch. Post procedure assessment: normal and unchanged  Patient tolerated the procedure well with no immediate complications.

## 2020-08-09 NOTE — Anesthesia Procedure Notes (Signed)
Procedure Name: Intubation Date/Time: 08/09/2020 7:46 AM Performed by: Alease Medina, CRNA Pre-anesthesia Checklist: Patient identified, Emergency Drugs available, Suction available and Patient being monitored Patient Re-evaluated:Patient Re-evaluated prior to induction Oxygen Delivery Method: Circle system utilized Preoxygenation: Pre-oxygenation with 100% oxygen Induction Type: IV induction Ventilation: Mask ventilation without difficulty and Oral airway inserted - appropriate to patient size Laryngoscope Size: Glidescope and 4 Grade View: Grade I Tube type: Oral Tube size: 8.0 mm Number of attempts: 1 Airway Equipment and Method: Stylet and Oral airway Placement Confirmation: ETT inserted through vocal cords under direct vision,  positive ETCO2 and breath sounds checked- equal and bilateral Secured at: 22 cm Tube secured with: Tape Dental Injury: Teeth and Oropharynx as per pre-operative assessment  Difficulty Due To: Difficulty was anticipated and Difficult Airway- due to limited oral opening

## 2020-08-09 NOTE — Anesthesia Procedure Notes (Signed)
Central Venous Catheter Insertion Performed by: Lillia Abed, MD, anesthesiologist Start/End8/30/2021 6:45 AM, 08/09/2020 7:00 AM Patient location: Pre-op. Preanesthetic checklist: patient identified, IV checked, risks and benefits discussed, surgical consent, monitors and equipment checked, pre-op evaluation, timeout performed and anesthesia consent Position: Trendelenburg Lidocaine 1% used for infiltration and patient sedated Hand hygiene performed  and maximum sterile barriers used  Catheter size: 8.5 Fr PA cath and Central line was placed.MAC introducer Swan type:thermodilution Procedure performed using ultrasound guided technique. Ultrasound Notes:anatomy identified, needle tip was noted to be adjacent to the nerve/plexus identified, no ultrasound evidence of intravascular and/or intraneural injection and image(s) printed for medical record Attempts: 1 Following insertion, line sutured, dressing applied and Biopatch. Post procedure assessment: blood return through all ports, free fluid flow and no air  Patient tolerated the procedure well with no immediate complications.

## 2020-08-09 NOTE — Progress Notes (Signed)
Pre Procedure note for inpatients:   Calvin Rodriguez has been scheduled for Procedure(s): CORONARY ARTERY BYPASS GRAFTING (CABG) (N/A) possible PLACEMENT OF IMPELLA 5.5 LEFT VENTRICULAR ASSIST DEVICE (N/A) TRANSESOPHAGEAL ECHOCARDIOGRAM (TEE) (N/A) today. The various methods of treatment have been discussed with the patient. After consideration of the risks, benefits and treatment options the patient has consented to the planned procedure.   The patient has been seen and labs reviewed. There are no changes in the patient's condition to prevent proceeding with the planned procedure today.  Recent labs:  Lab Results  Component Value Date   WBC 9.1 08/09/2020   HGB 13.8 08/09/2020   HCT 42.9 08/09/2020   PLT 280 08/09/2020   GLUCOSE 94 08/09/2020   CHOL 166 08/06/2020   TRIG 87 08/06/2020   HDL 34 (L) 08/06/2020   LDLCALC 115 (H) 08/06/2020   ALT 27 08/06/2020   AST 36 08/06/2020   NA 138 08/09/2020   K 3.8 08/09/2020   CL 104 08/09/2020   CREATININE 0.89 08/09/2020   BUN 9 08/09/2020   CO2 26 08/09/2020   TSH 1.874 08/05/2020   INR 1.0 08/07/2020   HGBA1C 5.8 (H) 08/05/2020    Mikey Bussing, MD 08/09/2020 7:27 AM

## 2020-08-09 NOTE — Progress Notes (Signed)
CT surgery p.m. Rounds  Status post CABG x4, ischemic cardiomyopathy with severe LV dysfunction Patient responsive on ventilator, sedation being weaned in preparation for ventilator wean Hemodynamics stable on milrinone and low-dose epinephrine Chest x-ray and oxygenation satisfactory to proceed with ventilator wean per protocol  Blood pressure 112/81, pulse (!) 106, temperature 98.2 F (36.8 C), resp. rate (!) 23, height 5\' 9"  (1.753 m), weight 82.1 kg, SpO2 99 %.

## 2020-08-09 NOTE — Transfer of Care (Signed)
Immediate Anesthesia Transfer of Care Note  Patient: Nicolus Ose IV Lorenzetti  Procedure(s) Performed: CORONARY ARTERY BYPASS GRAFTING (CABG) x 4, LIMA TO LAD, SVG TO OM1, SVG TO RAMUS, SVG TO PDA (N/A Chest) TRANSESOPHAGEAL ECHOCARDIOGRAM (TEE) (N/A ) ENDOVEIN HARVEST OF GREATER SAPHENOUS VEIN (Right Leg Upper)  Patient Location: PACU  Anesthesia Type:General  Level of Consciousness: sedated and Patient remains intubated per anesthesia plan  Airway & Oxygen Therapy: Patient remains intubated per anesthesia plan and Patient placed on Ventilator (see vital sign flow sheet for setting)  Post-op Assessment: Report given to RN and Post -op Vital signs reviewed and stable  Post vital signs: Reviewed and stable  Last Vitals:  Vitals Value Taken Time  BP    Temp    Pulse    Resp    SpO2      Last Pain:  Vitals:   08/09/20 0500  TempSrc: Oral  PainSc:          Complications: No complications documented.   Patient transported to ICU with standard monitors (HR, BP, SPO2, RR) and emergency drugs/equipment. Controlled ventilation maintained via ambu bag. Report given to bedside RN and respiratory therapist. Pt connected to ICU monitor and ventilator. All questions answered and vital signs stable before leaving

## 2020-08-09 NOTE — Hospital Course (Addendum)
History of Present Illness:  Mr. Neuman is a 54 yo male with known history Nicotine abuse, HTN, and CAD.  This dates back 20 years when the patient suffered an MI with subsequent PCI to LAD.  The patient had not been routinely followed since that time.  Prior to presentation the patient suffered 2 syncopal episodes.  He presented to the ED and evaluation showed his Troponin level to be positive.  He underwent Echocardiogram which showed a reduced EF of 25% without evidence of valvular disease.  He was transferred to [facility] for further care.   Hospital Course:  The patient denied chest pain during admission.  He underwent cardiac catheterization on 08/06/2020 which showed 3 vessel CAD.  It was felt coronary bypass grafting would be indicated and TCTS consult was obtained.  The patient was evaluated by Dr. Donata Clay who felt patient would best be treated with coronary bypass grafting.  The risks and benefits of the procedure were explained to the patient and he was agreeable to proceed.  He was taken to the operating room on 08/09/2020.  He underwent CABG x 4 utilizing LIMA to LAD, SVG to Ramus Intermediate, SVG to OM, and SVG to PDA.  He also underwent Endoscopic harvest of greater saphenous vein from his right leg.  He tolerated the procedure and was transferred to the SICU in stable condition.  He was extubated the evening of surgery without difficulty.  He was weaned off Levophed on POD #1.   He was weaned off Milrinone on 9/**/2021 and Epinephrine 9/**/2021.  His chest tubes were removed without difficulty.  He was started on lasix for hypervolemia.

## 2020-08-09 NOTE — Progress Notes (Signed)
  Echocardiogram Echocardiogram Transesophageal has been performed.  Gerda Diss 08/09/2020, 9:51 AM

## 2020-08-09 NOTE — Op Note (Signed)
NAME: Calvin Rodriguez, Calvin Rodriguez IV MEDICAL RECORD ER:[id number] ACCOUNT [id number] DATE OF BIRTH:Oct 07, 1966 FACILITY: [facility] LOCATION: [facility] PHYSICIAN:Cybill Uriegas VAN TRIGT III, MD  OPERATIVE REPORT  DATE OF PROCEDURE:  08/09/2020  OPERATION: 1.  Coronary artery bypass grafting x4 (left internal mammary artery to left anterior descending, saphenous vein graft to ramus intermediate, saphenous vein graft to the obtuse marginal, saphenous vein graft to posterior descending. 2.   Endoscopic harvest of right leg greater saphenous vein.  PREOPERATIVE DIAGNOSES:   1.  Ischemic cardiomyopathy, ejection fraction 20%.  2.  History of myocardial infarction and left anterior descending  percutaneous coronary intervention 20 years ago. 3.  History of recent syncopal episode, probably cardiac in origin.  POSTOPERATIVE DIAGNOSES:   1.  Ischemic cardiomyopathy, ejection fraction 20%.  2.  History of myocardial infarction and left anterior descending  percutaneous coronary intervention 20 years ago. 3.  History of recent syncopal episode, probably cardiac in origin.  SURGEON:  Kerin Perna, MD  ASSISTANT:  Lowella Dandy, PA-C.  ANESTHESIA:  General by Dr. Eilene Ghazi.  CLINICAL NOTE:  The patient is a 54 year old male with history of coronary artery disease, status post anterior MI and LAD stenting 20 years ago.  He had a syncopal episode associated with positive cardiac enzymes.  Echocardiogram showed ejection  fraction to be only 20%.  Cardiac catheterization demonstrated severe multivessel coronary disease and surgical coronary revascularization was recommended.  An echocardiogram showed no significant valvular disease with LV dilatation with a LV diameter of  6.5 cm.  RV function was fairly well preserved.  I saw the patient after reviewing his cardiac cath and echocardiogram images.  I agreed with the recommendation for surgical revascularization as his best long-term therapy, although it would be at  high risk due to his LV dysfunction.  The patient also  has a heavy history of smoking.  I discussed the procedure of CABG in detail with the patient, including the use of general anesthesia and cardiopulmonary bypass, the location of the surgical incisions, and the expected postoperative hospital recovery.   I discussed with him the potential risks of the surgery, including risks of stroke, bleeding, blood transfusion, postoperative infection, postoperative organ failure, postoperative pulmonary problems including pleural effusions, and death.  He  demonstrated his understanding and agreed to proceed with surgery under what I felt was an informed consent.  OPERATIVE FINDINGS: 1.  Small, but adequate targets. 2.  Adequate conduit. 3.  Improvement in global LV function after separation from cardiopulmonary bypass. 4.  No blood products required for the surgery.  DESCRIPTION OF PROCEDURE:  The patient was brought from the preoperative holding area where informed consent was documented and final issues were addressed.  The patient was placed supine on the operating table and general anesthesia was induced.  He  remained stable.  A transesophageal echo probe was placed by the anesthesia team.  The patient was then prepped and draped as a sterile field.  A proper time-out was performed.  A sternal incision was made as the saphenous vein was harvested  endoscopically from the right leg.  The left internal mammary artery was harvested as a pedicle graft from its origin at the subclavian vessels.  It was a 1.5 mm vessel with good flow.  The sternal retractor was placed and the pericardium was opened.   The aorta had no significant plaque.  The LV was dilated and hypocontractile.  Pursestrings were placed in the ascending aorta and right atrium and after heparin was administered,  the patient was cannulated and placed on bypass.  The coronaries were  identified for grafting and cardioplegia cannulas were placed  both antegrade and retrograde cold blood cardioplegia.  The mammary artery and vein grafts were prepared for the distal anastomoses and the patient was cooled to 32 degrees.  Aortic crossclamp  was applied, and 1 L of cold blood cardioplegia was delivered in split doses between the antegrade aortic and retrograde coronary sinus catheters.  There was good cardioplegic arrest and supple temperature dropped less than 14 degrees.  Cardioplegia was  delivered every 20 minutes.  The distal coronary anastomoses were performed.  The first distal anastomosis was the posterior descending branch of the right coronary.  This was totally occluded.  Reverse saphenous vein was sewn end-to-side with running 7-0 Prolene with good flow  through the graft.  Cardioplegia was redosed.  The second distal anastomosis was the OM branch of the left coronary.  This had a proximal 80% to 90% stenosis.  Reverse saphenous vein was sewn end-to-side with running 7-0 Prolene with good flow through the graft.  Cardioplegia was redosed.  The third distal anastomosis was the ramus branch of the left coronary.  This had a proximal 80% stenosis.  Reverse saphenous vein was sewn end-to-side with running 7-0 Prolene with good flow through the graft.  Cardioplegia was redosed.  The fourth distal anastomosis was to the mid or distal third of the LAD.  There was a proximal 90% stenosis.  The left IMA pedicle was brought through an opening in the left lateral pericardium and was brought down onto the LAD and sewn end-to-side with  running 8-0 Prolene.  There was good flow through the anastomosis after briefly releasing the pedicle bulldog on the mammary artery.  The bulldog was reapplied and the pedicle secured to the epicardium.  Cardioplegia was redosed.  While the crossclamp was still in place, 3 proximal vein anastomoses were performed on the ascending aorta using a 4.5 mm punch and running 6-0 Prolene.  Prior to tying down the final  proximal anastomosis, air was vented from the coronaries with a dose  of retrograde warm blood cardioplegia.  The crossclamp was removed.  The heart resumed a spontaneous rhythm.  The vein grafts were de-aired and opened and each had good flow.  Hemostasis was documented at the proximal and distal sites.  The patient was rewarmed and reperfused.  Cardioplegia catheters were removed.   Temporary pacing wires were applied.  The lungs were expanded.  The ventilator was resumed.  The patient was started on low dose milrinone and epinephrine.  After adequate reperfusion, weaned from cardiopulmonary bypass without difficulty.  Echo showed improvement in LV global function.  Protamine was administered without adverse reaction.  The  cannulas were removed.  The mediastinum was irrigated and found to be hemostatic.  The anterior mediastinal and bilateral pleural tubes were brought out through separate incisions.  The patient remained stable.  Cardiac output was 5 liters per minute.  Sternal wires were placed.  The sternum was closed.  The patient remained stable.  The pectoralis fascia was closed with a running #1 Vicryl.  Subcutaneous and skin layers were closed with a running Vicryl.  Sterile dressings were applied.  Total  cardiopulmonary bypass time was 130 minutes.  VN/NUANCE  D:08/09/2020 T:08/09/2020 JOB:[id number]

## 2020-08-09 NOTE — Brief Op Note (Signed)
08/05/2020 - 08/09/2020  7:50 AM  PATIENT:  Calvin Rodriguez  54 y.o. male  PRE-OPERATIVE DIAGNOSIS:  CORONARY ARTERY DISEASE  POST-OPERATIVE DIAGNOSIS:  CORONARY ARTERY DISEASE  PROCEDURE:  Procedure(s):  CORONARY ARTERY BYPASS GRAFTING x 4 -LIMA to LAD -SVG to RAMUS INTERMEDIATE -SVG to OM  -SVG to PDA  ENDOSCOPIC HARVEST OF GREATER SAPHENOUS VEIN -Right Leg  possible PLACEMENT OF IMPELLA 5.5 LEFT VENTRICULAR ASSIST DEVICE (N/A)   TRANSESOPHAGEAL ECHOCARDIOGRAM (TEE) (N/A)  SURGEON:  Surgeon(s) and Role:    Kerin Perna, MD - Primary  PHYSICIAN ASSISTANT: Lowella Dandy PA-C  ANESTHESIA:   general  EBL:  541 mL   BLOOD ADMINISTERED: CELLSAVER  DRAINS: Left Pleural Chest Tube, Mediastinal chest drains   LOCAL MEDICATIONS USED:  NONE  SPECIMEN:  No Specimen  DISPOSITION OF SPECIMEN:  N/A  COUNTS:  YES  TOURNIQUET:  * No tourniquets in log *  DICTATION: .Dragon Dictation  PLAN OF CARE: Admit to inpatient   PATIENT DISPOSITION:  ICU - intubated and hemodynamically stable.   Delay start of Pharmacological VTE agent (>24hrs) due to surgical blood loss or risk of bleeding: yes

## 2020-08-09 NOTE — Procedures (Signed)
Extubation Procedure Note  Patient Details:   Name: Calvin Rodriguez DOB: December 20, 1965 MRN: [id number]   Airway Documentation:    Vent end date: 08/09/20 Vent end time: 1804   Evaluation  O2 sats: stable throughout Complications: No apparent complications Patient did tolerate procedure well. Bilateral Breath Sounds: Rhonchi   Yes   Patient extubated per rapid wean protocol. NIF -34, VC 1.7L. Patient had positive cuff leak. No stridor noted. Vitals are stable on 4L [place].   Calvin Rodriguez 08/09/2020, 6:05 PM

## 2020-08-09 NOTE — Anesthesia Preprocedure Evaluation (Signed)
Anesthesia Evaluation  Patient identified by MRN, date of birth, ID band Patient awake    Reviewed: Allergy & Precautions, NPO status , Patient's Chart, lab work & pertinent test results  Airway Mallampati: II  TM Distance: >3 FB Neck ROM: Full    Dental no notable dental hx.    Pulmonary Current Smoker,    Pulmonary exam normal breath sounds clear to auscultation       Cardiovascular hypertension, + CAD and + Past MI  Normal cardiovascular exam Rhythm:Regular Rate:Normal  . Left ventricular ejection fraction, by estimation, is 25 to 30%. The  left ventricle has severely decreased function. The left ventricle  demonstrates global hypokinesis. Left ventricular diastolic parameters are  consistent with Grade II diastolic  dysfunction (pseudonormalization).  2. Right ventricular systolic function is normal. The right ventricular  size is normal.  3. Left atrial size was mildly dilated.  4. The mitral valve is normal in structure. Mild mitral valve  regurgitation. No evidence of mitral stenosis.  5. The aortic valve is normal in structure. Aortic valve regurgitation is  not visualized. No aortic stenosis is present.  6. The inferior vena cava is normal in size with greater than 50%  respiratory variability, suggesting right atrial pressure of 3 mmHg.   FINDINGS  Left Ventricle: Left ventricular ejection fraction, by estimation, is 25  to 30%. The left ventricle has severely decreased function. The left  ventricle demonstrates global hypokinesis. Definity contrast agent was  given IV to delineate the left  ventricular endocardial borders. The left ventricular internal cavity size  was normal in size. There is no left ventricular hypertrophy. Left  ventricular diastolic parameters are consistent with Grade II diastolic  dysfunction (pseudonormalization).     LV Wall Scoring:  The mid and distal anterior wall, mid and  distal anterior septum, entire  apex, mid and distal inferior wall, and mid inferoseptal segment are  akinetic.   Right Ventricle: The right ventricular size is normal. No increase in  right ventricular wall thickness. Right ventricular systolic function is  normal.   Left Atrium: Left atrial size was mildly dilated.   Right Atrium: Right atrial size was normal in size.   Pericardium: There is no evidence of pericardial effusion.   Mitral Valve: The mitral valve is normal in structure. Normal mobility of  the mitral valve leaflets. Mild mitral valve regurgitation. No evidence of  mitral valve stenosis.   Tricuspid Valve: The tricuspid valve is normal in structure. Tricuspid  valve regurgitation is not demonstrated. No evidence of tricuspid  stenosis.   Aortic Valve: The aortic valve is normal in structure. Aortic valve  regurgitation is not visualized. No aortic stenosis is present.   Pulmonic Valve: The pulmonic valve was normal in structure. Pulmonic valve  regurgitation is not visualized. No evidence of pulmonic stenosis.   Aorta: The aortic root is normal in size and structure.    Neuro/Psych negative neurological ROS  negative psych ROS   GI/Hepatic Neg liver ROS, GERD  ,  Endo/Other  negative endocrine ROS  Renal/GU negative Renal ROS  negative genitourinary   Musculoskeletal negative musculoskeletal ROS (+)   Abdominal   Peds negative pediatric ROS (+)  Hematology negative hematology ROS (+)   Anesthesia Other Findings   Reproductive/Obstetrics negative OB ROS                             Anesthesia Physical Anesthesia Plan  ASA: IV  Anesthesia Plan: General   Post-op Pain Management:    Induction: Intravenous  PONV Risk Score and Plan: 0  Airway Management Planned: Oral ETT  Additional Equipment: Arterial line, CVP, PA Cath, TEE and Ultrasound Guidance Line Placement  Intra-op Plan:   Post-operative Plan:  Post-operative intubation/ventilation  Informed Consent: I have reviewed the patients History and Physical, chart, labs and discussed the procedure including the risks, benefits and alternatives for the proposed anesthesia with the patient or authorized representative who has indicated his/her understanding and acceptance.     Dental advisory given  Plan Discussed with: CRNA and Surgeon  Anesthesia Plan Comments:         Anesthesia Quick Evaluation

## 2020-08-10 ENCOUNTER — Encounter (HOSPITAL_COMMUNITY): Payer: Self-pay | Admitting: Cardiothoracic Surgery

## 2020-08-10 ENCOUNTER — Inpatient Hospital Stay (HOSPITAL_COMMUNITY): Payer: Commercial Managed Care - PPO

## 2020-08-10 LAB — BASIC METABOLIC PANEL
Anion gap: 9 (ref 5–15)
Anion gap: 5 (ref 5–15)
BUN: 8 mg/dL (ref 6–20)
BUN: 8 mg/dL (ref 6–20)
CO2: 21 mmol/L — ABNORMAL LOW (ref 22–32)
CO2: 27 mmol/L (ref 22–32)
Calcium: 8 mg/dL — ABNORMAL LOW (ref 8.9–10.3)
Calcium: 8 mg/dL — ABNORMAL LOW (ref 8.9–10.3)
Chloride: 106 mmol/L (ref 98–111)
Chloride: 105 mmol/L (ref 98–111)
Creatinine, Ser: 0.95 mg/dL (ref 0.61–1.24)
Creatinine, Ser: 0.93 mg/dL (ref 0.61–1.24)
GFR calc Af Amer: >60 mL/min (ref >60)
GFR calc Af Amer: >60 mL/min (ref >60)
GFR calc non Af Amer: >60 mL/min (ref >60)
GFR calc non Af Amer: >60 mL/min (ref >60)
Glucose, Bld: 144 mg/dL — ABNORMAL HIGH (ref 70–99)
Glucose, Bld: 113 mg/dL — ABNORMAL HIGH (ref 70–99)
Potassium: 4.6 mmol/L (ref 3.5–5.1)
Potassium: 4.5 mmol/L (ref 3.5–5.1)
Sodium: 136 mmol/L (ref 135–145)
Sodium: 137 mmol/L (ref 135–145)

## 2020-08-10 LAB — GLUCOSE, CAPILLARY
Glucose-Capillary: 113 mg/dL — ABNORMAL HIGH (ref 70–99)
Glucose-Capillary: 120 mg/dL — ABNORMAL HIGH (ref 70–99)
Glucose-Capillary: 132 mg/dL — ABNORMAL HIGH (ref 70–99)
Glucose-Capillary: 137 mg/dL — ABNORMAL HIGH (ref 70–99)
Glucose-Capillary: 146 mg/dL — ABNORMAL HIGH (ref 70–99)
Glucose-Capillary: 166 mg/dL — ABNORMAL HIGH (ref 70–99)
Glucose-Capillary: 95 mg/dL (ref 70–99)

## 2020-08-10 LAB — MAGNESIUM
Magnesium: 2.2 mg/dL (ref 1.7–2.4)
Magnesium: 2.3 mg/dL (ref 1.7–2.4)

## 2020-08-10 LAB — CBC
HCT: 34.4 % — ABNORMAL LOW (ref 39.0–52.0)
HCT: 35 % — ABNORMAL LOW (ref 39.0–52.0)
Hemoglobin: 11.5 g/dL — ABNORMAL LOW (ref 13.0–17.0)
Hemoglobin: 11.2 g/dL — ABNORMAL LOW (ref 13.0–17.0)
MCH: 32 pg (ref 26.0–34.0)
MCH: 31.5 pg (ref 26.0–34.0)
MCHC: 33.4 g/dL (ref 30.0–36.0)
MCHC: 32 g/dL (ref 30.0–36.0)
MCV: 95.8 fL (ref 80.0–100.0)
MCV: 98.6 fL (ref 80.0–100.0)
Platelets: 229 10*3/uL (ref 150–400)
Platelets: 186 10*3/uL (ref 150–400)
RBC: 3.59 MIL/uL — ABNORMAL LOW (ref 4.22–5.81)
RBC: 3.55 MIL/uL — ABNORMAL LOW (ref 4.22–5.81)
RDW: 13.2 % (ref 11.5–15.5)
RDW: 13.5 % (ref 11.5–15.5)
WBC: 13.7 10*3/uL — ABNORMAL HIGH (ref 4.0–10.5)
WBC: 10 10*3/uL (ref 4.0–10.5)
nRBC: 0 % (ref 0.0–0.2)
nRBC: 0 % (ref 0.0–0.2)

## 2020-08-10 LAB — ECHO INTRAOPERATIVE TEE
AV Mean grad: 4 mmHg
Height: 69 in
Single Plane A4C EF: 30.8 %
Weight: 2895.96 oz

## 2020-08-10 LAB — COOXEMETRY PANEL
Carboxyhemoglobin: 1.8 % — ABNORMAL HIGH (ref 0.5–1.5)
Carboxyhemoglobin: 1.9 % — ABNORMAL HIGH (ref 0.5–1.5)
Methemoglobin: 1.1 % (ref 0.0–1.5)
Methemoglobin: 1.2 % (ref 0.0–1.5)
O2 Saturation: 99.2 %
O2 Saturation: 72 %
Total hemoglobin: 12 g/dL (ref 12.0–16.0)
Total hemoglobin: 11.5 g/dL — ABNORMAL LOW (ref 12.0–16.0)

## 2020-08-10 MED ORDER — FUROSEMIDE 10 MG/ML IJ SOLN
40.0000 mg | Freq: Two times a day (BID) | INTRAMUSCULAR | Status: DC
  Administered 2020-08-10 (×2): 40 mg via INTRAVENOUS
  Filled 2020-08-10 (×2): qty 4

## 2020-08-10 MED ORDER — FUROSEMIDE 10 MG/ML IJ SOLN: 40.0000 mg | Freq: Once | INTRAMUSCULAR | Status: DC

## 2020-08-10 MED ORDER — MIDAZOLAM HCL 2 MG/2ML IJ SOLN: 2.0000 mg | INTRAMUSCULAR | Status: DC | PRN

## 2020-08-10 MED ORDER — ENOXAPARIN SODIUM 40 MG/0.4ML ~~LOC~~ SOLN
40.0000 mg | Freq: Every day | SUBCUTANEOUS | Status: DC
  Administered 2020-08-10 – 2020-08-12 (×3): 40 mg via SUBCUTANEOUS
  Filled 2020-08-10 (×3): qty 0.4

## 2020-08-10 MED ORDER — MORPHINE SULFATE (PF) 2 MG/ML IV SOLN
1.0000 mg | INTRAVENOUS | Status: DC | PRN
  Administered 2020-08-10: 2 mg via INTRAVENOUS
  Administered 2020-08-10: 4 mg via INTRAVENOUS
  Filled 2020-08-10: qty 1
  Filled 2020-08-10: qty 2

## 2020-08-10 MED FILL — Heparin Sodium (Porcine) Inj 1000 Unit/ML: INTRAMUSCULAR | Qty: 30 | Status: AC

## 2020-08-10 MED FILL — Magnesium Sulfate Inj 50%: INTRAMUSCULAR | Qty: 10 | Status: AC

## 2020-08-10 MED FILL — Potassium Chloride Inj 2 mEq/ML: INTRAVENOUS | Qty: 40 | Status: AC

## 2020-08-10 NOTE — Progress Notes (Addendum)
[address]       [place] [address]             [phone]      1 Day Post-Op Procedure(s) (LRB): CORONARY ARTERY BYPASS GRAFTING (CABG) x 4, LIMA TO LAD, SVG TO OM1, SVG TO RAMUS, SVG TO PDA (N/A) TRANSESOPHAGEAL ECHOCARDIOGRAM (TEE) (N/A) ENDOVEIN HARVEST OF GREATER SAPHENOUS VEIN (Right)   Subjective:  States doing okay.  He is having some pain which was worse once they stood him up.  Medication is helping.  Denies nausea.  Objective: Vital signs in last 24 hours: Temp:  [96.6 F (35.9 C)-99.7 F (37.6 C)] 99 F (37.2 C) (08/31 0730) Pulse Rate:  [89-107] 92 (08/31 0730) Cardiac Rhythm: Sinus tachycardia (08/30 2000) Resp:  [12-29] 19 (08/31 0730) BP: (85-126)/(58-91) 106/58 (08/31 0730) SpO2:  [95 %-100 %] 97 % (08/31 0730) Arterial Line BP: (88-160)/(51-97) 119/53 (08/31 0730) FiO2 (%):  [40 %-50 %] 40 % (08/30 1722) Weight:  [88 kg] 88 kg (08/31 0630)  Hemodynamic parameters for last 24 hours: PAP: (20-50)/(8-31) 29/14 CVP:  [8 mmHg-22 mmHg] 12 mmHg PCWP:  [13 mmHg] 13 mmHg CO:  [3 L/min-6.2 L/min] 6.2 L/min CI:  [2 L/min/m2-6 L/min/m2] 3.1 L/min/m2  Intake/Output from previous day: 08/30 0701 - 08/31 0700 In: 4511.2 [I.V.:2813.7; Blood:351; IV Piggyback:1346.5] Out: 2921 [Urine:2020; Blood:541; Chest Tube:360]  General appearance: alert, cooperative and no distress Heart: regular rate and rhythm Lungs: clear to auscultation bilaterally Abdomen: soft, non-tender; bowel sounds normal; no masses,  no organomegaly Extremities: edema 1+ Wound: clean and dry EVH, aquacel in place  Lab Results: Recent Labs    08/09/20 1901 08/10/20 0445  WBC 16.0* 13.7*  HGB 12.2*  12.2* 11.5*  HCT 37.2*  36.0* 34.4*  PLT 232 229   BMET:  Recent Labs    08/09/20 1901 08/10/20 0445  NA 138  141 136  K 4.8  4.7 4.6  CL 110 106  CO2 20* 21*  GLUCOSE 145* 144*  BUN 9 8  CREATININE 0.95 0.95  CALCIUM 8.0* 8.0*    PT/INR:  Recent Labs     08/09/20 1329  LABPROT 16.4*  INR 1.4*   ABG    Component Value Date/Time   PHART 7.331 (L) 08/09/2020 1901   HCO3 21.1 08/09/2020 1901   TCO2 22 08/09/2020 1901   ACIDBASEDEF 4.0 (H) 08/09/2020 1901   O2SAT 72.0 08/10/2020 0730   CBG (last 3)  Recent Labs    08/09/20 2313 08/09/20 2359 08/10/20 0451  GLUCAP 170* 166* 137*    Assessment/Plan: S/P Procedure(s) (LRB): CORONARY ARTERY BYPASS GRAFTING (CABG) x 4, LIMA TO LAD, SVG TO OM1, SVG TO RAMUS, SVG TO PDA (N/A) TRANSESOPHAGEAL ECHOCARDIOGRAM (TEE) (N/A) ENDOVEIN HARVEST OF GREATER SAPHENOUS VEIN (Right)  1. CV- NSR, on Milrinone at 0.3 and Epinephrine at 3, levophed has been stopped... Coox is 72, hold off on BB 2. Pulm- CT output is low, no pleural effusions, bilateral atelectasis 3. Renal- creatinine WNL, weight is up 11 lbs, will give IV lasix today 4. Expected post operative blood loss anemia,  Mild Hgb at 11.5 5. CBGs controlled, not a diabetic, off insulin drip. Already on SSIP 6. dispo- patient stable, wean Milrinone, Epi as hemodynamics allow, likely d/c chest tubes today, start diuresis, POD #1 progression orders   LOS: 5 days    Lowella Dandy, PA-C 08/10/2020  stable after urgent CABG for ischemic     cardiomyopathy Slow wean of  inotrope's due to severe pre op LV dysfunction, follow coox  Wt up 12 lbs- lasix bid  patient examined and medical record reviewed,agree with above note. Kathlee Nations Trigt III 08/10/2020

## 2020-08-10 NOTE — Progress Notes (Signed)
TCTS BRIEF SICU PROGRESS NOTE  1 Day Post-Op  S/P Procedure(s) (LRB): CORONARY ARTERY BYPASS GRAFTING (CABG) x 4, LIMA TO LAD, SVG TO OM1, SVG TO RAMUS, SVG TO PDA (N/A) TRANSESOPHAGEAL ECHOCARDIOGRAM (TEE) (N/A) ENDOVEIN HARVEST OF GREATER SAPHENOUS VEIN (Right)   Stable day NSR w/ stable BP on milrinone Breathing comfortably on nasal cannula UOP adequate Labs okay  Plan: Continue current plan  Purcell Nails, MD 08/10/2020 8:02 PM

## 2020-08-10 NOTE — Progress Notes (Signed)
Cardiology Note: POD #1 CABG Pt looks good - sitting up in chair at bedside, on IV Milrinone 0.25. Tele reviewed and shows NSR 91 bpm with no significant arrhythmia.  Surgical team notes reviewed - pt being diuresed, progressing well in early post-op period. Will follow  Calvin Rodriguez 08/10/2020 11:35 AM

## 2020-08-11 ENCOUNTER — Inpatient Hospital Stay: Payer: Self-pay

## 2020-08-11 ENCOUNTER — Inpatient Hospital Stay (HOSPITAL_COMMUNITY): Payer: Commercial Managed Care - PPO

## 2020-08-11 LAB — BASIC METABOLIC PANEL
Anion gap: 9 (ref 5–15)
BUN: 10 mg/dL (ref 6–20)
CO2: 25 mmol/L (ref 22–32)
Calcium: 8.2 mg/dL — ABNORMAL LOW (ref 8.9–10.3)
Chloride: 102 mmol/L (ref 98–111)
Creatinine, Ser: 0.92 mg/dL (ref 0.61–1.24)
GFR calc Af Amer: >60 mL/min (ref >60)
GFR calc non Af Amer: >60 mL/min (ref >60)
Glucose, Bld: 97 mg/dL (ref 70–99)
Potassium: 3.7 mmol/L (ref 3.5–5.1)
Sodium: 136 mmol/L (ref 135–145)

## 2020-08-11 LAB — CBC
HCT: 32.6 % — ABNORMAL LOW (ref 39.0–52.0)
Hemoglobin: 10.5 g/dL — ABNORMAL LOW (ref 13.0–17.0)
MCH: 31.7 pg (ref 26.0–34.0)
MCHC: 32.2 g/dL (ref 30.0–36.0)
MCV: 98.5 fL (ref 80.0–100.0)
Platelets: 168 10*3/uL (ref 150–400)
RBC: 3.31 MIL/uL — ABNORMAL LOW (ref 4.22–5.81)
RDW: 13.6 % (ref 11.5–15.5)
WBC: 9.5 10*3/uL (ref 4.0–10.5)
nRBC: 0 % (ref 0.0–0.2)

## 2020-08-11 LAB — GLUCOSE, CAPILLARY
Glucose-Capillary: 98 mg/dL (ref 70–99)
Glucose-Capillary: 148 mg/dL — ABNORMAL HIGH (ref 70–99)
Glucose-Capillary: 94 mg/dL (ref 70–99)
Glucose-Capillary: 89 mg/dL (ref 70–99)
Glucose-Capillary: 94 mg/dL (ref 70–99)

## 2020-08-11 LAB — COOXEMETRY PANEL
Carboxyhemoglobin: 1.6 % — ABNORMAL HIGH (ref 0.5–1.5)
Methemoglobin: 0.7 % (ref 0.0–1.5)
O2 Saturation: 67.8 %
Total hemoglobin: 12.3 g/dL (ref 12.0–16.0)

## 2020-08-11 MED ORDER — GUAIFENESIN ER 600 MG PO TB12
600.0000 mg | ORAL_TABLET | Freq: Two times a day (BID) | ORAL | Status: DC
  Administered 2020-08-11 – 2020-08-13 (×5): 600 mg via ORAL
  Filled 2020-08-11 (×5): qty 1

## 2020-08-11 MED ORDER — POTASSIUM CHLORIDE CRYS ER 20 MEQ PO TBCR
40.0000 meq | EXTENDED_RELEASE_TABLET | Freq: Every day | ORAL | Status: DC
  Administered 2020-08-11 – 2020-08-13 (×3): 40 meq via ORAL
  Filled 2020-08-11 (×3): qty 2

## 2020-08-11 MED ORDER — MILRINONE LACTATE IN DEXTROSE 20-5 MG/100ML-% IV SOLN
0.1250 ug/kg/min | INTRAVENOUS | Status: DC
  Administered 2020-08-11: 0.125 ug/kg/min via INTRAVENOUS
  Filled 2020-08-11: qty 100

## 2020-08-11 MED ORDER — FUROSEMIDE 10 MG/ML IJ SOLN
40.0000 mg | Freq: Every day | INTRAMUSCULAR | Status: DC
  Administered 2020-08-12 – 2020-08-13 (×2): 40 mg via INTRAVENOUS
  Filled 2020-08-11 (×2): qty 4

## 2020-08-11 MED ORDER — CARVEDILOL 3.125 MG PO TABS
3.1250 mg | ORAL_TABLET | Freq: Two times a day (BID) | ORAL | Status: DC
  Administered 2020-08-12 – 2020-08-13 (×3): 3.125 mg via ORAL
  Filled 2020-08-11 (×4): qty 1

## 2020-08-11 MED ORDER — INSULIN ASPART 100 UNIT/ML ~~LOC~~ SOLN: 0.0000 [IU] | Freq: Three times a day (TID) | SUBCUTANEOUS | Status: DC

## 2020-08-11 MED ORDER — ALPRAZOLAM 0.5 MG PO TABS: 0.5000 mg | ORAL_TABLET | Freq: Every evening | ORAL | Status: DC | PRN

## 2020-08-11 MED ORDER — SODIUM CHLORIDE 0.9% FLUSH
10.0000 mL | Freq: Two times a day (BID) | INTRAVENOUS | Status: DC
  Administered 2020-08-11: 10 mL via INTRAVENOUS
  Administered 2020-08-12: 40 mL via INTRAVENOUS
  Administered 2020-08-12 – 2020-08-13 (×2): 10 mL via INTRAVENOUS

## 2020-08-11 MED ORDER — SODIUM CHLORIDE 0.9% FLUSH: 10.0000 mL | INTRAVENOUS | Status: DC | PRN

## 2020-08-11 MED ORDER — POTASSIUM CHLORIDE 10 MEQ/100ML IV SOLN
10.0000 meq | Freq: Once | INTRAVENOUS | Status: AC
  Administered 2020-08-11: 10 meq via INTRAVENOUS
  Filled 2020-08-11: qty 100

## 2020-08-11 MED ORDER — POTASSIUM CHLORIDE 10 MEQ/50ML IV SOLN: 10.0000 meq | Freq: Once | INTRAVENOUS | Status: DC

## 2020-08-11 MED ORDER — SODIUM CHLORIDE 0.9% FLUSH: 10.0000 mL | Freq: Two times a day (BID) | INTRAVENOUS | Status: DC

## 2020-08-11 NOTE — Progress Notes (Addendum)
[address]       [place] [address]             [phone]      2 Days Post-Op Procedure(s) (LRB): CORONARY ARTERY BYPASS GRAFTING (CABG) x 4, LIMA TO LAD, SVG TO OM1, SVG TO RAMUS, SVG TO PDA (N/A) TRANSESOPHAGEAL ECHOCARDIOGRAM (TEE) (N/A) ENDOVEIN HARVEST OF GREATER SAPHENOUS VEIN (Right)   Subjective:  Up in chair, tired and wants to get back in bed.  He has had some nausea this morning.  Pain is improved.  He ambulated some in the ICU, but wasn't able to complete the full lap.  He isn't passing gas and has not yet moved his bowels.  Objective: Vital signs in last 24 hours: Temp:  [97.8 F (36.6 C)-99.1 F (37.3 C)] 97.8 F (36.6 C) (09/01 0400) Pulse Rate:  [79-92] 82 (09/01 0700) Cardiac Rhythm: Normal sinus rhythm (09/01 0400) Resp:  [11-22] 17 (09/01 0700) BP: (87-110)/(53-70) 97/60 (09/01 0700) SpO2:  [90 %-98 %] 96 % (09/01 0700) Arterial Line BP: (71-128)/(52-65) 102/52 (08/31 1000) Weight:  [86 kg] 86 kg (09/01 0500)  Hemodynamic parameters for last 24 hours: PAP: (27-30)/(14-19) 27/18 CVP:  [11 mmHg-12 mmHg] 11 mmHg CO:  [6.2 L/min] 6.2 L/min CI:  [3.1 L/min/m2] 3.1 L/min/m2  Intake/Output from previous day: 08/31 0701 - 09/01 0700 In: 1293 [P.O.:360; I.V.:732.9; IV Piggyback:200.1] Out: 2540 [Urine:2480; Chest Tube:60]  General appearance: alert, cooperative and no distress Heart: regular rate and rhythm Lungs: diminished breath sounds on the left Abdomen: soft, non-tender; bowel sounds normal; no masses,  no organomegaly Extremities: edema trace, improving Wound: clean and dry EVH site, aquacel in place on sternotomy  Lab Results: Recent Labs    08/10/20 1600 08/11/20 0455  WBC 10.0 9.5  HGB 11.2* 10.5*  HCT 35.0* 32.6*  PLT 186 168   BMET:  Recent Labs    08/10/20 1600 08/11/20 0455  NA 137 136  K 4.5 3.7  CL 105 102  CO2 27 25  GLUCOSE 113* 97  BUN 8 10  CREATININE 0.93 0.92  CALCIUM 8.0* 8.2*      PT/INR:  Recent Labs    08/09/20 1329  LABPROT 16.4*  INR 1.4*   ABG    Component Value Date/Time   PHART 7.331 (L) 08/09/2020 1901   HCO3 21.1 08/09/2020 1901   TCO2 22 08/09/2020 1901   ACIDBASEDEF 4.0 (H) 08/09/2020 1901   O2SAT 67.8 08/11/2020 0455   CBG (last 3)  Recent Labs    08/10/20 1950 08/10/20 2346 08/11/20 0356  GLUCAP 146* 95 98    Assessment/Plan: S/P Procedure(s) (LRB): CORONARY ARTERY BYPASS GRAFTING (CABG) x 4, LIMA TO LAD, SVG TO OM1, SVG TO RAMUS, SVG TO PDA (N/A) TRANSESOPHAGEAL ECHOCARDIOGRAM (TEE) (N/A) ENDOVEIN HARVEST OF GREATER SAPHENOUS VEIN (Right)  1. CV- NSR, Epinephrine has been weaned off, remains on Milrinone at 0.25, with Coox at 67.8.Marland KitchenMarland Kitchen can possibly wean Milrinone to 0.125 will defer to Dr. Donata Clay 2. Pulm- no acute issues, continue IS, tiny left pleural effusion 3. Renal- creatinine WNL, weight improved after diuretics, will repeat IV Lasix today, K at 3.7 will supplement... will leave foley in place today with aggressive IV Lasix 4. GI- advance diet slowly, patient's bowels have not woken up and with nausea could be developing early ileus 5. Expected post operative blood loss anemia, Hgb stable at 10.5 6. CBGs controlled, continue SSIP, patient is not a diabetic will d/c insulin tomorrow  7. Dispo- patient stable in NSR, weaning Milrinone per Dr. Donata Clay, continue diuretics, watch GI system as patient has not yet started on pass gas, he is nauseated, diet advancement slowly, continue current care,   LOS: 6 days    Lowella Dandy, PA-C 08/11/2020  slow wean of milrinone and follow coox for EF .20 Will prob need to be DCed  home  with vest due to documented preop  syncope   Start low dose coreg tomorrow patient examined and medical record reviewed,agree with above note. Kathlee Nations Trigt III 08/11/2020

## 2020-08-11 NOTE — Progress Notes (Signed)
Progress Note  Patient Name: Calvin Rodriguez Date of Encounter: 08/11/2020  [facility] Cardiologist: Bing Matter  Subjective   Feeling weak this am. NO pain.   Inpatient Medications    Scheduled Meds: . acetaminophen  1,000 mg Oral Q6H   Or  . acetaminophen (TYLENOL) oral liquid 160 mg/5 mL  1,000 mg Per Tube Q6H  . aspirin EC  325 mg Oral Daily   Or  . aspirin  324 mg Per Tube Daily  . atorvastatin  80 mg Oral Daily  . bisacodyl  10 mg Oral Daily   Or  . bisacodyl  10 mg Rectal Daily  . [START ON 08/12/2020] carvedilol  3.125 mg Oral BID WC  . Chlorhexidine Gluconate Cloth  6 each Topical Daily  . docusate sodium  200 mg Oral Daily  . enoxaparin (LOVENOX) injection  40 mg Subcutaneous QHS  . [START ON 08/12/2020] furosemide  40 mg Intravenous Daily  . guaiFENesin  600 mg Oral BID  . insulin aspart  0-24 Units Subcutaneous TID WC & HS  . metoCLOPramide (REGLAN) injection  10 mg Intravenous Q6H  . mometasone-formoterol  2 puff Inhalation BID  . nicotine  14 mg Transdermal Daily  . pantoprazole  40 mg Oral Daily  . potassium chloride  40 mEq Oral Daily  . sodium chloride flush  10-40 mL Intracatheter Q12H  . sodium chloride flush  3 mL Intravenous Q12H   Continuous Infusions: . sodium chloride    . sodium chloride    . sodium chloride 10 mL/hr at 08/10/20 2300  . cefUROXime (ZINACEF)  IV 1.5 g (08/11/20 0909)  . lactated ringers    . lactated ringers Stopped (08/10/20 2105)  . milrinone    . potassium chloride     PRN Meds: sodium chloride, ALPRAZolam, metoprolol tartrate, morphine injection, nicotine polacrilex, ondansetron (ZOFRAN) IV, oxyCODONE, sodium chloride flush, sodium chloride flush, traMADol   Vital Signs    Vitals:   08/11/20 0500 08/11/20 0600 08/11/20 0700 08/11/20 0810  BP: (!) 103/59 94/60 97/60    Pulse: 79 83 82   Resp: 14 13 17    Temp:    98.3 F (36.8 C)  TempSrc:      SpO2: 93% 93% 96%   Weight: 86 kg     Height:         Intake/Output Summary (Last 24 hours) at 08/11/2020 0914 Last data filed at 08/11/2020 0700 Gross per 24 hour  Intake 998.26 ml  Output 2540 ml  Net -1541.74 ml   Last 3 Weights 08/11/2020 08/10/2020 08/09/2020  Weight (lbs) 189 lb 9.5 oz 194 lb 0.1 oz 181 lb  Weight (kg) 86 kg 88 kg 82.1 kg      Telemetry    Sinus- Personally Reviewed  ECG    No AM EKG- Personally Reviewed  Physical Exam   General: Well developed, well nourished, NAD  HEENT: OP clear, mucus membranes moist  SKIN: warm, dry. No rashes. Neuro: No focal deficits  Musculoskeletal: Muscle strength 5/5 all ext  Psychiatric: Mood and affect normal  Neck: No JVD, no carotid bruits, no thyromegaly, no lymphadenopathy.  Lungs:Clear bilaterally, no wheezes, rhonci, crackles Cardiovascular: Regular rate and rhythm. No murmurs, gallops or rubs. Abdomen:Soft. Bowel sounds present. Non-tender.  Extremities: No lower extremity edema. Pulses are 2 + in the bilateral DP/PT.   Labs    High Sensitivity Troponin:   Recent Labs  Lab 08/05/20 1458 08/05/20 1701  TROPONINIHS 9,076* 08/07/20*  Chemistry Recent Labs  Lab 08/06/20 [phone] 08/07/20 0219 08/10/20 0445 08/10/20 1600 08/11/20 0455  NA 141   < > 136 137 136  K 4.0   < > 4.6 4.5 3.7  CL 107   < > 106 105 102  CO2 25   < > 21* 27 25  GLUCOSE 93   < > 144* 113* 97  BUN 13   < > 8 8 10   CREATININE 1.00   < > 0.95 0.93 0.92  CALCIUM 8.6*   < > 8.0* 8.0* 8.2*  PROT 6.1*  --   --   --   --   ALBUMIN 3.3*  --   --   --   --   AST 36  --   --   --   --   ALT 27  --   --   --   --   ALKPHOS 55  --   --   --   --   BILITOT 1.0  --   --   --   --   GFRNONAA >60   < > >60 >60 >60  GFRAA >60   < > >60 >60 >60  ANIONGAP 9   < > 9 5 9    < > = values in this interval not displayed.     Hematology Recent Labs  Lab 08/10/20 0445 08/10/20 1600 08/11/20 0455  WBC 13.7* 10.0 9.5  RBC 3.59* 3.55* 3.31*  HGB 11.5* 11.2* 10.5*  HCT 34.4* 35.0* 32.6*  MCV 95.8  98.6 98.5  MCH 32.0 31.5 31.7  MCHC 33.4 32.0 32.2  RDW 13.2 13.5 13.6  PLT 229 186 168    BNP Recent Labs  Lab 08/05/20 1458  BNP 422.9*     DDimer No results for input(s): DDIMER in the last 168 hours.   Radiology    DG Chest Port 1 View  Result Date: 08/11/2020 CLINICAL DATA:  Status post coronary bypass graft. EXAM: PORTABLE CHEST 1 VIEW COMPARISON:  August 10, 2020. FINDINGS: Stable cardiomediastinal silhouette. Swan-Ganz catheter has been removed. Left-sided chest tube has been removed. No pneumothorax is noted. Mild bibasilar subsegmental atelectasis is noted. No significant pleural effusion is noted. Bony thorax is unremarkable. IMPRESSION: No pneumothorax status post left-sided chest tube removal. Mild bibasilar subsegmental atelectasis. Electronically Signed   By: 10/11/2020 M.D.   On: 08/11/2020 08:52   DG Chest Port 1 View  Result Date: 08/10/2020 CLINICAL DATA:  Open-heart surgery.  Chest tube.  Sore chest. EXAM: PORTABLE CHEST 1 VIEW COMPARISON:  08/09/2020. FINDINGS: Interim extubation and removal of NG tube. Swan-Ganz catheter, mediastinal drainage catheter, and bilateral chest tubes in stable position. Prior CABG. Stable cardiomegaly. Stable bibasilar atelectasis. No pleural effusion or pneumothorax. IMPRESSION: 1. Interim extubation and removal of NG tube. Swan-Ganz catheter, mediastinal drainage catheter, and bilateral chest tubes in stable position. 2.  Prior CABG.  Stable cardiomegaly. 3.  Stable bibasilar atelectasis. Electronically Signed   By: 08/12/2020  Register   On: 08/10/2020 07:01   DG Chest Port 1 View  Result Date: 08/09/2020 CLINICAL DATA:  Post CABG EXAM: PORTABLE CHEST 1 VIEW COMPARISON:  Portable exam 1425 hours compared to preoperative exam of 08/05/2020 FINDINGS: Tip of endotracheal tube projects approximately 6 cm above carina. Nasogastric tube extends into stomach. BILATERAL thoracostomy tubes present. RIGHT jugular Swan-Ganz catheter tip projecting  over RIGHT pulmonary artery proximally. Epicardial pacing wires. Enlargement of cardiac silhouette post CABG. Mediastinal contours normal. Bibasilar atelectasis. No pleural  effusion or pneumothorax. IMPRESSION: Postoperative changes as above. Bibasilar atelectasis. Electronically Signed   By: Ulyses Southward M.D.   On: 08/09/2020 14:53   [place] EKG SITE RITE  Result Date: 08/11/2020 If Site Rite image not attached, placement could not be confirmed due to current cardiac rhythm.   Cardiac Studies     Patient Profile     54 y.o. male with multivessel CAD, smoker/prediabetes, presenting with elevated cardiac enzymes and severely depressed LVEF. Echo with LVEF=25-30%.   Assessment & Plan    1. CAD/NSTEMI: POD# 2 s/p CABG. Doing well this am. BP stable. Plans to wean milrinone today. Low dose Coreg tomorrow. Continue ASA and statin.   2. Ischemic cardiomyopathy: Volume status ok. Plans to start a beta blocker tomorrow. He had been on Entresto at home.   3. Hx of syncope: May have been related to hypotension during titration of HF meds. Discussion about possible Lifevest before discharge.   For questions or updates, please contact [facility] Please consult [email or web address] for contact info under        Signed, Verne Carrow, MD  08/11/2020, 9:14 AM

## 2020-08-11 NOTE — Progress Notes (Signed)
Peripherally Inserted Central Catheter Placement  The IV Nurse has discussed with the patient and/or persons authorized to consent for the patient, the purpose of this procedure and the potential benefits and risks involved with this procedure.  The benefits include less needle sticks, lab draws from the catheter, and the patient may be discharged home with the catheter. Risks include, but not limited to, infection, bleeding, blood clot (thrombus formation), and puncture of an artery; nerve damage and irregular heartbeat and possibility to perform a PICC exchange if needed/ordered by physician.  Alternatives to this procedure were also discussed.  Bard Power PICC patient education guide, fact sheet on infection prevention and patient information card has been provided to patient /or left at bedside.    PICC Placement Documentation  PICC Double Lumen 08/11/20 Right Basilic 40 cm 0 cm (Active)  Indication for Insertion or Continuance of Line Prolonged intravenous therapies 08/11/20 1257  Exposed Catheter (cm) 0 cm 08/11/20 1257  Site Assessment Clean;Dry;Intact 08/11/20 1257  Lumen #1 Status Blood return noted;Flushed;Saline locked 08/11/20 1257  Lumen #2 Status Blood return noted;Flushed;Saline locked 08/11/20 1257  Dressing Type Transparent;Securing device 08/11/20 1257  Dressing Intervention Other (Comment) 08/11/20 1257  Dressing Change Due 08/18/20 08/11/20 1257       Romie Jumper 08/11/2020, 1:00 PM

## 2020-08-11 NOTE — Anesthesia Postprocedure Evaluation (Signed)
Anesthesia Post Note  Patient: Calvin Rodriguez  Procedure(s) Performed: CORONARY ARTERY BYPASS GRAFTING (CABG) x 4, LIMA TO LAD, SVG TO OM1, SVG TO RAMUS, SVG TO PDA (N/A Chest) TRANSESOPHAGEAL ECHOCARDIOGRAM (TEE) (N/A ) ENDOVEIN HARVEST OF GREATER SAPHENOUS VEIN (Right Leg Upper)     Patient location during evaluation: SICU Anesthesia Type: General Level of consciousness: sedated Pain management: pain level controlled Vital Signs Assessment: post-procedure vital signs reviewed and stable Respiratory status: patient remains intubated per anesthesia plan Cardiovascular status: stable Postop Assessment: no apparent nausea or vomiting Anesthetic complications: no   No complications documented.  Last Vitals:  Vitals:   08/11/20 0700 08/11/20 0810  BP: 97/60   Pulse: 82   Resp: 17   Temp:  36.8 C  SpO2: 96%     Last Pain:  Vitals:   08/11/20 0400  TempSrc: Oral  PainSc: Asleep                 Shalandria Elsbernd S

## 2020-08-11 NOTE — Progress Notes (Signed)
Patient ID: Calvin Rodriguez, male   DOB: 08/30/1966, 54 y.o.   MRN: [id number] EVENING ROUNDS NOTE :     [address]       [place] [address]             [phone]                 2 Days Post-Op Procedure(s) (LRB): CORONARY ARTERY BYPASS GRAFTING (CABG) x 4, LIMA TO LAD, SVG TO OM1, SVG TO RAMUS, SVG TO PDA (N/A) TRANSESOPHAGEAL ECHOCARDIOGRAM (TEE) (N/A) ENDOVEIN HARVEST OF GREATER SAPHENOUS VEIN (Right)  Total Length of Stay:  LOS: 6 days  BP 93/71   Pulse 93   Temp 97.7 F (36.5 C)   Resp (!) 21   Ht 5\' 9"  (1.753 m)   Wt 86 kg   SpO2 97%   BMI 28.00 kg/m   .Intake/Output      08/31 0701 - 09/01 0700 09/01 0701 - 09/02 0700   P.O. 360 120   I.V. (mL/kg) 732.9 (8.5) 34.5 (0.4)   Blood     IV Piggyback 200.1    Total Intake(mL/kg) 1293 (15) 154.5 (1.8)   Urine (mL/kg/hr) 2480 (1.2) 520 (0.6)   Blood     Chest Tube 60    Total Output 2540 520   Net -1247 -365.6          . sodium chloride    . sodium chloride    . sodium chloride Stopped (08/11/20 1140)  . lactated ringers    . lactated ringers Stopped (08/10/20 2105)  . milrinone 0.125 mcg/kg/min (08/11/20 1600)     Lab Results  Component Value Date   WBC 9.5 08/11/2020   HGB 10.5 (L) 08/11/2020   HCT 32.6 (L) 08/11/2020   PLT 168 08/11/2020   GLUCOSE 97 08/11/2020   CHOL 166 08/06/2020   TRIG 87 08/06/2020   HDL 34 (L) 08/06/2020   LDLCALC 115 (H) 08/06/2020   ALT 27 08/06/2020   AST 36 08/06/2020   NA 136 08/11/2020   K 3.7 08/11/2020   CL 102 08/11/2020   CREATININE 0.92 08/11/2020   BUN 10 08/11/2020   CO2 25 08/11/2020   TSH 1.874 08/05/2020   INR 1.4 (H) 08/09/2020   HGBA1C 5.8 (H) 08/05/2020   Stable postop Up to chair  Waiting for transfer to stepdown   08/07/2020 MD  Beeper [phone] Office [phone] 08/11/2020 5:03 PM

## 2020-08-12 ENCOUNTER — Inpatient Hospital Stay (HOSPITAL_COMMUNITY): Payer: Commercial Managed Care - PPO

## 2020-08-12 ENCOUNTER — Encounter: Payer: Self-pay | Admitting: Cardiothoracic Surgery

## 2020-08-12 DIAGNOSIS — I255 Ischemic cardiomyopathy: Secondary | ICD-10-CM

## 2020-08-12 LAB — BPAM RBC
Blood Product Expiration Date: 202109062359
Blood Product Expiration Date: 202109072359
Blood Product Expiration Date: 202109082359
Blood Product Expiration Date: 202109082359
ISSUE DATE / TIME: 202108191444
ISSUE DATE / TIME: 202108191444
ISSUE DATE / TIME: 202108230755
ISSUE DATE / TIME: 202108230755
Unit Type and Rh: 6200
Unit Type and Rh: 6200
Unit Type and Rh: 6200
Unit Type and Rh: 6200

## 2020-08-12 LAB — BASIC METABOLIC PANEL
Anion gap: 11 (ref 5–15)
BUN: 11 mg/dL (ref 6–20)
CO2: 23 mmol/L (ref 22–32)
Calcium: 8.5 mg/dL — ABNORMAL LOW (ref 8.9–10.3)
Chloride: 104 mmol/L (ref 98–111)
Creatinine, Ser: 0.85 mg/dL (ref 0.61–1.24)
GFR calc Af Amer: >60 mL/min (ref >60)
GFR calc non Af Amer: >60 mL/min (ref >60)
Glucose, Bld: 92 mg/dL (ref 70–99)
Potassium: 3.8 mmol/L (ref 3.5–5.1)
Sodium: 138 mmol/L (ref 135–145)

## 2020-08-12 LAB — GLUCOSE, CAPILLARY
Glucose-Capillary: 93 mg/dL (ref 70–99)
Glucose-Capillary: 170 mg/dL — ABNORMAL HIGH (ref 70–99)
Glucose-Capillary: 131 mg/dL — ABNORMAL HIGH (ref 70–99)
Glucose-Capillary: 96 mg/dL (ref 70–99)

## 2020-08-12 LAB — TYPE AND SCREEN
ABO/RH(D): A POS
Antibody Screen: NEGATIVE
Unit division: 0
Unit division: 0
Unit division: 0
Unit division: 0

## 2020-08-12 LAB — CBC
HCT: 33.5 % — ABNORMAL LOW (ref 39.0–52.0)
Hemoglobin: 10.8 g/dL — ABNORMAL LOW (ref 13.0–17.0)
MCH: 31.3 pg (ref 26.0–34.0)
MCHC: 32.2 g/dL (ref 30.0–36.0)
MCV: 97.1 fL (ref 80.0–100.0)
Platelets: 180 10*3/uL (ref 150–400)
RBC: 3.45 MIL/uL — ABNORMAL LOW (ref 4.22–5.81)
RDW: 13.7 % (ref 11.5–15.5)
WBC: 10.1 10*3/uL (ref 4.0–10.5)
nRBC: 0 % (ref 0.0–0.2)

## 2020-08-12 LAB — COOXEMETRY PANEL
Carboxyhemoglobin: 2 % — ABNORMAL HIGH (ref 0.5–1.5)
Methemoglobin: 1.1 % (ref 0.0–1.5)
O2 Saturation: 90.6 %
Total hemoglobin: 11.7 g/dL — ABNORMAL LOW (ref 12.0–16.0)

## 2020-08-12 NOTE — Progress Notes (Signed)
Progress Note  Patient Name: Calvin Rodriguez Date of Encounter: 08/12/2020  [facility] Cardiologist: Bing Matter  Subjective   No complaints. No chest pain or dyspnea. Walked around the unit twice this am  Inpatient Medications    Scheduled Meds:  acetaminophen  1,000 mg Oral Q6H   Or   acetaminophen (TYLENOL) oral liquid 160 mg/5 mL  1,000 mg Per Tube Q6H   aspirin EC  325 mg Oral Daily   Or   aspirin  324 mg Per Tube Daily   atorvastatin  80 mg Oral Daily   bisacodyl  10 mg Oral Daily   Or   bisacodyl  10 mg Rectal Daily   carvedilol  3.125 mg Oral BID WC   Chlorhexidine Gluconate Cloth  6 each Topical Daily   docusate sodium  200 mg Oral Daily   enoxaparin (LOVENOX) injection  40 mg Subcutaneous QHS   furosemide  40 mg Intravenous Daily   guaiFENesin  600 mg Oral BID   insulin aspart  0-24 Units Subcutaneous TID WC & HS   metoCLOPramide (REGLAN) injection  10 mg Intravenous Q6H   mometasone-formoterol  2 puff Inhalation BID   nicotine  14 mg Transdermal Daily   pantoprazole  40 mg Oral Daily   potassium chloride  40 mEq Oral Daily   sodium chloride flush  10-40 mL Intravenous Q12H   sodium chloride flush  3 mL Intravenous Q12H   Continuous Infusions:  sodium chloride     sodium chloride     sodium chloride Stopped (08/11/20 1140)   lactated ringers     lactated ringers Stopped (08/10/20 2105)   milrinone 0.125 mcg/kg/min (08/12/20 0100)   PRN Meds: sodium chloride, ALPRAZolam, metoprolol tartrate, morphine injection, nicotine polacrilex, ondansetron (ZOFRAN) IV, oxyCODONE, sodium chloride flush, sodium chloride flush, traMADol   Vital Signs    Vitals:   08/11/20 2200 08/12/20 0000 08/12/20 0015 08/12/20 0345  BP:   123/72   Pulse: 74 91 97   Resp: (!) 24 19 18    Temp:  99.7 F (37.6 C)  98.7 F (37.1 C)  TempSrc:  Oral  Oral  SpO2: 93% 93% 93%   Weight:      Height:        Intake/Output Summary (Last 24 hours)  at 08/12/2020 0722 Last data filed at 08/12/2020 0600 Gross per 24 hour  Intake 207.45 ml  Output 1320 ml  Net -1112.55 ml   Last 3 Weights 08/11/2020 08/10/2020 08/09/2020  Weight (lbs) 189 lb 9.5 oz 194 lb 0.1 oz 181 lb  Weight (kg) 86 kg 88 kg 82.1 kg      Telemetry    Sinus tachycardia- Personally Reviewed  ECG    No AM EKG- Personally Reviewed  Physical Exam   General: Well developed, well nourished, NAD  HEENT: OP clear, mucus membranes moist  SKIN: warm, dry. No rashes. Neuro: No focal deficits  Musculoskeletal: Muscle strength 5/5 all ext  Psychiatric: Mood and affect normal  Neck: No JVD, no carotid bruits, no thyromegaly, no lymphadenopathy.  Lungs:Clear bilaterally, no wheezes, rhonci, crackles Cardiovascular: Regular, tachy. No murmurs, gallops or rubs. Abdomen:Soft. Bowel sounds present. Non-tender.  Extremities: 1 + bilateral lower extremity edema.  Labs    High Sensitivity Troponin:   Recent Labs  Lab 08/05/20 1458 08/05/20 1701  TROPONINIHS 9,076* 9,897*      Chemistry Recent Labs  Lab 08/06/20 08/08/20 08/07/20 0219 08/10/20 1600 08/11/20 0455 08/12/20 0515  NA 141   < >  137 136 138  K 4.0   < > 4.5 3.7 3.8  CL 107   < > 105 102 104  CO2 25   < > 27 25 23   GLUCOSE 93   < > 113* 97 92  BUN 13   < > 8 10 11   CREATININE 1.00   < > 0.93 0.92 0.85  CALCIUM 8.6*   < > 8.0* 8.2* 8.5*  PROT 6.1*  --   --   --   --   ALBUMIN 3.3*  --   --   --   --   AST 36  --   --   --   --   ALT 27  --   --   --   --   ALKPHOS 55  --   --   --   --   BILITOT 1.0  --   --   --   --   GFRNONAA >60   < > >60 >60 >60  GFRAA >60   < > >60 >60 >60  ANIONGAP 9   < > 5 9 11    < > = values in this interval not displayed.     Hematology Recent Labs  Lab 08/10/20 1600 08/11/20 0455 08/12/20 0515  WBC 10.0 9.5 10.1  RBC 3.55* 3.31* 3.45*  HGB 11.2* 10.5* 10.8*  HCT 35.0* 32.6* 33.5*  MCV 98.6 98.5 97.1  MCH 31.5 31.7 31.3  MCHC 32.0 32.2 32.2  RDW 13.5 13.6 13.7   PLT 186 168 180    BNP Recent Labs  Lab 08/05/20 1458  BNP 422.9*     DDimer No results for input(s): DDIMER in the last 168 hours.   Radiology    DG Chest Port 1 View  Result Date: 08/11/2020 CLINICAL DATA:  Status post coronary bypass graft. EXAM: PORTABLE CHEST 1 VIEW COMPARISON:  August 10, 2020. FINDINGS: Stable cardiomediastinal silhouette. Swan-Ganz catheter has been removed. Left-sided chest tube has been removed. No pneumothorax is noted. Mild bibasilar subsegmental atelectasis is noted. No significant pleural effusion is noted. Bony thorax is unremarkable. IMPRESSION: No pneumothorax status post left-sided chest tube removal. Mild bibasilar subsegmental atelectasis. Electronically Signed   By: 08/07/20 M.D.   On: 08/11/2020 08:52   August 12, 2020 EKG SITE RITE  Result Date: 08/11/2020 If Site Rite image not attached, placement could not be confirmed due to current cardiac rhythm.   Cardiac Studies   Echo 08/06/20: 1. Left ventricular ejection fraction, by estimation, is 25 to 30%. The  left ventricle has severely decreased function. The left ventricle  demonstrates global hypokinesis. Left ventricular diastolic parameters are  consistent with Grade II diastolic  dysfunction (pseudonormalization).  2. Right ventricular systolic function is normal. The right ventricular  size is normal.  3. Left atrial size was mildly dilated.  4. The mitral valve is normal in structure. Mild mitral valve  regurgitation. No evidence of mitral stenosis.  5. The aortic valve is normal in structure. Aortic valve regurgitation is  not visualized. No aortic stenosis is present.  6. The inferior vena cava is normal in size with greater than 50%  respiratory variability, suggesting right atrial pressure of 3 mmHg.   Cardiac cath 08/06/20:  Mid LM lesion is 35% stenosed.  Prox LAD to Mid LAD lesion is 80% stenosed.  1st Diag lesion is 90% stenosed.  Ramus lesion is 60%  stenosed.  Prox Cx to Mid Cx lesion is 80% stenosed.  Prox RCA to  Mid RCA lesion is 100% stenosed.  There is severe left ventricular systolic dysfunction.  LV end diastolic pressure is mildly elevated.  The left ventricular ejection fraction is less than 25% by visual estimate.   1. Severe 3 vessel obstructive CAD 2. Severe LV dysfunction. EF estimated at 20-25% 3. Mildly elevated LVEDP   Patient Profile     54 y.o. male with multivessel CAD, smoker/prediabetes, presenting with elevated cardiac enzymes and severely depressed LVEF. Echo with LVEF=25-30%. Cardiac cath with severe three vessel CAD  Assessment & Plan    1. CAD/NSTEMI: POD# 3 s/p CABG. He is hemodynamically stable today. Continue ASA, statin and beta blocker.    2. Ischemic cardiomyopathy: Still with mild volume overload. Continue Lasix. Start Coreg today. He had been on Entresto at home. Restart as BP tolerates.   3. Hx of syncope: May have been related to hypotension during titration of HF meds.   For questions or updates, please contact [facility] Please consult [email or web address] for contact info under        Signed, Verne Carrow, MD  08/12/2020, 7:22 AM

## 2020-08-12 NOTE — Discharge Instructions (Signed)

## 2020-08-12 NOTE — Progress Notes (Signed)
CARDIAC REHAB PHASE I   Offered to walk with pt. Pt states he walked 2 laps independently without concerns this am. Pt anxious to leave, states he misses his son and has concerns over the financials. D/c education completed on pt. Pt educated on importance of site care and monitoring incision daily. Encouraged continued IS use, walks, and sternal precautions. Pt given in-the-tube sheet along with heart healthy diet. Spoke of concerns over low heart function. Reviewed restrictions and exercise guidelines. Pt states he is done with smoking, tip sheet given. Will refer to CRP II [place].  [id number] Reynold Bowen, RN BSN 08/12/2020 9:05 AM

## 2020-08-12 NOTE — Progress Notes (Addendum)
T[facility] DAILY ICU PROGRESS NOTE                   [address]            [place] [address]          [phone]   3 Days Post-Op Procedure(s) (LRB): CORONARY ARTERY BYPASS GRAFTING (CABG) x 4, LIMA TO LAD, SVG TO OM1, SVG TO RAMUS, SVG TO PDA (N/A) TRANSESOPHAGEAL ECHOCARDIOGRAM (TEE) (N/A) ENDOVEIN HARVEST OF GREATER SAPHENOUS VEIN (Right)  Total Length of Stay:  LOS: 7 days   Subjective:  Patient is without complaints.  He is adamant he needs to leave today.  He states he had this surgery against his will and he is going to have pay 20% of his bill everyday he is here and he cant afford this.  I educated the patient he is not medically ready for discharge, but he states he is leaving today.  Objective: Vital signs in last 24 hours: Temp:  [97.7 F (36.5 C)-99.7 F (37.6 C)] 98.6 F (37 C) (09/02 0800) Pulse Rate:  [51-97] 97 (09/02 0015) Cardiac Rhythm: Normal sinus rhythm (09/01 2000) Resp:  [12-24] 18 (09/02 0015) BP: (93-128)/(67-97) 128/82 (09/02 0800) SpO2:  [93 %-98 %] 97 % (09/02 0800) Weight:  [85.2 kg] 85.2 kg (09/02 0800)  Filed Weights   08/10/20 0630 08/11/20 0500 08/12/20 0800  Weight: 88 kg 86 kg 85.2 kg    Weight change:    Intake/Output from previous day: 09/01 0701 - 09/02 0700 In: 207.5 [P.O.:120; I.V.:87.5] Out: 1320 [Urine:1320]  Current Meds: Scheduled Meds: . acetaminophen  1,000 mg Oral Q6H   Or  . acetaminophen (TYLENOL) oral liquid 160 mg/5 mL  1,000 mg Per Tube Q6H  . aspirin EC  325 mg Oral Daily   Or  . aspirin  324 mg Per Tube Daily  . atorvastatin  80 mg Oral Daily  . bisacodyl  10 mg Oral Daily   Or  . bisacodyl  10 mg Rectal Daily  . carvedilol  3.125 mg Oral BID WC  . Chlorhexidine Gluconate Cloth  6 each Topical Daily  . docusate sodium  200 mg Oral Daily  . enoxaparin (LOVENOX) injection  40 mg Subcutaneous QHS  . furosemide  40 mg Intravenous Daily  . guaiFENesin  600 mg Oral BID  . insulin aspart   0-24 Units Subcutaneous TID WC & HS  . metoCLOPramide (REGLAN) injection  10 mg Intravenous Q6H  . mometasone-formoterol  2 puff Inhalation BID  . nicotine  14 mg Transdermal Daily  . pantoprazole  40 mg Oral Daily  . potassium chloride  40 mEq Oral Daily  . sodium chloride flush  10-40 mL Intravenous Q12H  . sodium chloride flush  3 mL Intravenous Q12H   Continuous Infusions: . sodium chloride    . sodium chloride    . sodium chloride Stopped (08/11/20 1140)  . lactated ringers    . lactated ringers Stopped (08/10/20 2105)  . milrinone 0.125 mcg/kg/min (08/12/20 0100)   PRN Meds:.sodium chloride, ALPRAZolam, metoprolol tartrate, morphine injection, nicotine polacrilex, ondansetron (ZOFRAN) IV, oxyCODONE, sodium chloride flush, sodium chloride flush, traMADol  General appearance: alert, cooperative and no distress Heart: regular rate and rhythm Lungs: clear to auscultation bilaterally Abdomen: soft, non-tender; bowel sounds normal; no masses,  no organomegaly Extremities: edema trace Wound: clean and dry  Lab Results: CBC: Recent Labs    08/11/20 0455 08/12/20 0515  WBC 9.5 10.1  HGB  10.5* 10.8*  HCT 32.6* 33.5*  PLT 168 180   BMET:  Recent Labs    08/11/20 0455 08/12/20 0515  NA 136 138  K 3.7 3.8  CL 102 104  CO2 25 23  GLUCOSE 97 92  BUN 10 11  CREATININE 0.92 0.85  CALCIUM 8.2* 8.5*    CMET: Lab Results  Component Value Date   WBC 10.1 08/12/2020   HGB 10.8 (L) 08/12/2020   HCT 33.5 (L) 08/12/2020   PLT 180 08/12/2020   GLUCOSE 92 08/12/2020   CHOL 166 08/06/2020   TRIG 87 08/06/2020   HDL 34 (L) 08/06/2020   LDLCALC 115 (H) 08/06/2020   ALT 27 08/06/2020   AST 36 08/06/2020   NA 138 08/12/2020   K 3.8 08/12/2020   CL 104 08/12/2020   CREATININE 0.85 08/12/2020   BUN 11 08/12/2020   CO2 23 08/12/2020   TSH 1.874 08/05/2020   INR 1.4 (H) 08/09/2020   HGBA1C 5.8 (H) 08/05/2020      PT/INR:  Recent Labs    08/09/20 1329  LABPROT 16.4*   INR 1.4*   Radiology: DG Chest Port 1 View  Result Date: 08/12/2020 CLINICAL DATA:  Post sternotomy, chest soreness EXAM: PORTABLE CHEST 1 VIEW COMPARISON:  Chest CT August 06, 2020, chest x-ray of August 11, 2020 FINDINGS: RIGHT-sided internal jugular vascular sheath has been removed. RIGHT-sided PICC line placed with the tip at the caval to atrial junction. Post sternotomy and changes of CABG as before. Cardiomediastinal contours are stable. Improved aeration since the previous exam still with linear opacities at the RIGHT lung base and probable small bilateral pleural fluid effusions. On limited assessment no acute skeletal process. IMPRESSION: 1. Improved aeration since the previous exam still with linear opacities at the RIGHT lung base, likely atelectasis associated with small bilateral pleural effusions. 2. Interval removal of the RIGHT-sided internal jugular vascular sheath and placement of RIGHT-sided PICC line. Electronically Signed   By: Donzetta Kohut M.D.   On: 08/12/2020 07:55     Assessment/Plan: S/P Procedure(s) (LRB): CORONARY ARTERY BYPASS GRAFTING (CABG) x 4, LIMA TO LAD, SVG TO OM1, SVG TO RAMUS, SVG TO PDA (N/A) TRANSESOPHAGEAL ECHOCARDIOGRAM (TEE) (N/A) ENDOVEIN HARVEST OF GREATER SAPHENOUS VEIN (Right)  1. CV- Sinus Tachycardia-Milrinone at 0.125 mg, Co-ox 90---will stop Milrinone today, Coreg ordered by Dr. Donata Clay 2. Pulm- no acute issues, off oxygen no acute issues 3. Renal- creatinine WNL, weight is trending down, will transition to daily dosing 4. GI- nausea resolved, bowel sounds improved, no N/V 5. CBGs controlled, will stop insulin 6. Dispo- patient stable, stop Milrinone today, low dose Coreg started by Dr. Donata Clay, continue diuretics, patient is not medically stable for discharge today.. if he leaves he will need to sign AMA paperwork, hopefully Dr. Donata Clay can get patient to agree to stay  Lowella Dandy, PA-C  08/12/2020 8:37 AM   With preoperative  syncope and EF less than 25% he needs evaluation by EP for defibrillator vest at discharge Resume entresto as outpatient, caused preop hypotension DC EPWs in am   medical record reviewed,agree with above note. Kathlee Nations Trigt III 08/12/2020

## 2020-08-12 NOTE — Progress Notes (Signed)
      [address]       [place] [address]             [phone]      POD # 3   Anxious to go home  BP 111/66 (BP Location: Left Arm)   Pulse 97   Temp 99.1 F (37.3 C) (Oral)   Resp (!) 24   Ht 5\' 9"  (1.753 m)   Wt 85.2 kg   SpO2 97%   BMI 27.74 kg/m    Intake/Output Summary (Last 24 hours) at 08/12/2020 1720 Last data filed at 08/12/2020 1300 Gross per 24 hour  Intake 531.42 ml  Output 2000 ml  Net -1468.58 ml   Awaiting bed on 2C  Calvin Rodriguez C. 10/12/2020, MD [facility] [phone]

## 2020-08-12 NOTE — Plan of Care (Signed)

## 2020-08-13 LAB — BASIC METABOLIC PANEL
Anion gap: 11 (ref 5–15)
BUN: 10 mg/dL (ref 6–20)
CO2: 25 mmol/L (ref 22–32)
Calcium: 8.7 mg/dL — ABNORMAL LOW (ref 8.9–10.3)
Chloride: 104 mmol/L (ref 98–111)
Creatinine, Ser: 0.9 mg/dL (ref 0.61–1.24)
GFR calc Af Amer: >60 mL/min (ref >60)
GFR calc non Af Amer: >60 mL/min (ref >60)
Glucose, Bld: 109 mg/dL — ABNORMAL HIGH (ref 70–99)
Potassium: 4 mmol/L (ref 3.5–5.1)
Sodium: 140 mmol/L (ref 135–145)

## 2020-08-13 LAB — CBC
HCT: 35.7 % — ABNORMAL LOW (ref 39.0–52.0)
Hemoglobin: 11.7 g/dL — ABNORMAL LOW (ref 13.0–17.0)
MCH: 31.3 pg (ref 26.0–34.0)
MCHC: 32.8 g/dL (ref 30.0–36.0)
MCV: 95.5 fL (ref 80.0–100.0)
Platelets: 265 10*3/uL (ref 150–400)
RBC: 3.74 MIL/uL — ABNORMAL LOW (ref 4.22–5.81)
RDW: 13.7 % (ref 11.5–15.5)
WBC: 9.8 10*3/uL (ref 4.0–10.5)
nRBC: 0 % (ref 0.0–0.2)

## 2020-08-13 MED ORDER — CARVEDILOL 3.125 MG PO TABS
3.1250 mg | ORAL_TABLET | Freq: Two times a day (BID) | ORAL | 3 refills | Status: DC
Start: 2020-08-13 — End: 2020-10-07

## 2020-08-13 MED ORDER — LOSARTAN POTASSIUM 25 MG PO TABS
25.0000 mg | ORAL_TABLET | Freq: Every day | ORAL | Status: DC
  Administered 2020-08-13: 25 mg via ORAL
  Filled 2020-08-13: qty 1

## 2020-08-13 MED ORDER — LOSARTAN POTASSIUM 25 MG PO TABS
25.0000 mg | ORAL_TABLET | Freq: Every day | ORAL | 1 refills | Status: DC
Start: 2020-08-13 — End: 2020-10-12

## 2020-08-13 MED ORDER — POTASSIUM CHLORIDE CRYS ER 20 MEQ PO TBCR: 20.0000 meq | EXTENDED_RELEASE_TABLET | Freq: Every day | ORAL | 0 refills | Status: DC

## 2020-08-13 MED ORDER — GUAIFENESIN ER 600 MG PO TB12: 600.0000 mg | ORAL_TABLET | Freq: Two times a day (BID) | ORAL | Status: DC | PRN

## 2020-08-13 MED ORDER — TRAMADOL HCL 50 MG PO TABS
50.0000 mg | ORAL_TABLET | ORAL | 0 refills | Status: DC | PRN
Start: 2020-08-13 — End: 2020-09-01

## 2020-08-13 MED ORDER — FUROSEMIDE 40 MG PO TABS: 40.0000 mg | ORAL_TABLET | Freq: Every day | ORAL | 0 refills | Status: DC

## 2020-08-13 MED FILL — Calcium Chloride Inj 10%: INTRAVENOUS | Qty: 10 | Status: AC

## 2020-08-13 MED FILL — Mannitol IV Soln 20%: INTRAVENOUS | Qty: 500 | Status: AC

## 2020-08-13 MED FILL — Lidocaine HCl Local Soln Prefilled Syringe 100 MG/5ML (2%): INTRAMUSCULAR | Qty: 5 | Status: AC

## 2020-08-13 MED FILL — Electrolyte-R (PH 7.4) Solution: INTRAVENOUS | Qty: 4000 | Status: AC

## 2020-08-13 MED FILL — Heparin Sodium (Porcine) Inj 1000 Unit/ML: INTRAMUSCULAR | Qty: 20 | Status: AC

## 2020-08-13 MED FILL — Sodium Bicarbonate IV Soln 8.4%: INTRAVENOUS | Qty: 50 | Status: AC

## 2020-08-13 MED FILL — Sodium Chloride IV Soln 0.9%: INTRAVENOUS | Qty: 5000 | Status: AC

## 2020-08-13 MED FILL — LOSARTAN POTASSIUM 25 MG TA: 25 | 30 days supply | Qty: 30 | Fill #0

## 2020-08-13 MED FILL — traMADol HCL 50 MG TABS: 50 | 7 days supply | Qty: 30 | Fill #0

## 2020-08-13 MED FILL — POTASSIUM CHLORIDE CRYS ER: 20 | 7 days supply | Qty: 7 | Fill #0

## 2020-08-13 MED FILL — FUROSEMIDE 40 MG TAB: 40 | 7 days supply | Qty: 7 | Fill #0

## 2020-08-13 MED FILL — CARVEDILOL 3.125 MG TABLET: 3.125 | 30 days supply | Qty: 60 | Fill #0

## 2020-08-13 NOTE — Progress Notes (Signed)
CARDIAC REHAB PHASE I   Pt walking independently without difficulty. Pt denies CP, SOB, or dizziness. Anxious to go home today and see his son. Expresses some frustrations about conflicting information regarding Lifevest. Reached out to PA to make arrangements and clarify. Pt denies further questions or concerns at this time. Pt referred to CRP II .  [phone] Reynold Bowen, RN BSN 08/13/2020 10:20 AM

## 2020-08-13 NOTE — Progress Notes (Signed)
   Asked to assist with obtaining a LifeVest for this patient with EF 30-35% in the setting of NSTEMI, now s/p CABG. Records faxed and [employer] rep, Fredirick Maudlin to assist with placement today.   Beatriz Stallion, PA-C 08/13/20; 9:41 AM

## 2020-08-13 NOTE — Progress Notes (Addendum)
Pt provided with verbal discharge instruction. Paper copy given to patient. RN answered all questions.  PICC line removed. Sutures removed. Life vest brought to room and patient was educated on Life vest by Marchelle Folks. Pt belongings sent with patient. RN and NT discharged patient via wheelchair to [employer] entrance.

## 2020-08-13 NOTE — Progress Notes (Signed)
RN goes into the room to round on patient. Patient is up in bathroom after being told to remain in bed for 20-30 min after PICC line removal. RN will re-educate patient about remaining in bed after PICC removed.

## 2020-08-13 NOTE — Progress Notes (Addendum)
Progress Note  Patient Name: Calvin Rodriguez Date of Encounter: 08/13/2020  [facility] Cardiologist: Bing Matter  Subjective   No complaints this am.   Inpatient Medications    Scheduled Meds: . acetaminophen  1,000 mg Oral Q6H   Or  . acetaminophen (TYLENOL) oral liquid 160 mg/5 mL  1,000 mg Per Tube Q6H  . aspirin EC  325 mg Oral Daily   Or  . aspirin  324 mg Per Tube Daily  . atorvastatin  80 mg Oral Daily  . bisacodyl  10 mg Oral Daily   Or  . bisacodyl  10 mg Rectal Daily  . carvedilol  3.125 mg Oral BID WC  . Chlorhexidine Gluconate Cloth  6 each Topical Daily  . docusate sodium  200 mg Oral Daily  . enoxaparin (LOVENOX) injection  40 mg Subcutaneous QHS  . furosemide  40 mg Intravenous Daily  . guaiFENesin  600 mg Oral BID  . nicotine  14 mg Transdermal Daily  . pantoprazole  40 mg Oral Daily  . potassium chloride  40 mEq Oral Daily  . sodium chloride flush  10-40 mL Intravenous Q12H  . sodium chloride flush  3 mL Intravenous Q12H   Continuous Infusions: . sodium chloride    . sodium chloride    . sodium chloride Stopped (08/11/20 1140)  . lactated ringers    . lactated ringers Stopped (08/10/20 2105)   PRN Meds: sodium chloride, ALPRAZolam, metoprolol tartrate, morphine injection, nicotine polacrilex, ondansetron (ZOFRAN) IV, oxyCODONE, sodium chloride flush, sodium chloride flush, traMADol   Vital Signs    Vitals:   08/12/20 1935 08/12/20 2315 08/13/20 0355 08/13/20 0729  BP: (!) 129/93 117/90 100/76 130/89  Pulse: (!) 110 (!) 106 100   Resp: 20 20 16 18   Temp: 98.6 F (37 C) 98.3 F (36.8 C) 98.4 F (36.9 C) 98.4 F (36.9 C)  TempSrc: Oral Oral Oral Oral  SpO2: 94% 95% 96%   Weight:   83.2 kg   Height:        Intake/Output Summary (Last 24 hours) at 08/13/2020 0855 Last data filed at 08/12/2020 1800 Gross per 24 hour  Intake 251.5 ml  Output 1200 ml  Net -948.5 ml   Last 3 Weights 08/13/2020 08/12/2020 08/11/2020  Weight (lbs) 183 lb  6.8 oz 187 lb 13.3 oz 189 lb 9.5 oz  Weight (kg) 83.2 kg 85.2 kg 86 kg      Telemetry    Sinus - Personally Reviewed  ECG    No AM EKG- Personally Reviewed  Physical Exam   General: Well developed, well nourished, NAD  HEENT: OP clear, mucus membranes moist  SKIN: warm, dry. No rashes. Neuro: No focal deficits  Musculoskeletal: Muscle strength 5/5 all ext  Psychiatric: Mood and affect normal  Neck: No JVD, no carotid bruits, no thyromegaly, no lymphadenopathy.  Lungs:Clear bilaterally, no wheezes, rhonci, crackles Cardiovascular: Regular rate and rhythm. No murmurs, gallops or rubs. Abdomen:Soft. Bowel sounds present. Non-tender.  Extremities: 2+ bilateral lower extremity edema.   Labs    High Sensitivity Troponin:   Recent Labs  Lab 08/05/20 1458 08/05/20 1701  TROPONINIHS 9,076* 9,897*      Chemistry Recent Labs  Lab 08/10/20 1600 08/11/20 0455 08/12/20 0515  NA 137 136 138  K 4.5 3.7 3.8  CL 105 102 104  CO2 27 25 23   GLUCOSE 113* 97 92  BUN 8 10 11   CREATININE 0.93 0.92 0.85  CALCIUM 8.0* 8.2* 8.5*  GFRNONAA >  60 >60 >60  GFRAA >60 >60 >60  ANIONGAP 5 9 11      Hematology Recent Labs  Lab 08/11/20 0455 08/12/20 0515 08/13/20 0625  WBC 9.5 10.1 9.8  RBC 3.31* 3.45* 3.74*  HGB 10.5* 10.8* 11.7*  HCT 32.6* 33.5* 35.7*  MCV 98.5 97.1 95.5  MCH 31.7 31.3 31.3  MCHC 32.2 32.2 32.8  RDW 13.6 13.7 13.7  PLT 168 180 265    BNP No results for input(s): BNP, PROBNP in the last 168 hours.   DDimer No results for input(s): DDIMER in the last 168 hours.   Radiology    DG Chest Port 1 View  Result Date: 08/12/2020 CLINICAL DATA:  Post sternotomy, chest soreness EXAM: PORTABLE CHEST 1 VIEW COMPARISON:  Chest CT August 06, 2020, chest x-ray of August 11, 2020 FINDINGS: RIGHT-sided internal jugular vascular sheath has been removed. RIGHT-sided PICC line placed with the tip at the caval to atrial junction. Post sternotomy and changes of CABG as before.  Cardiomediastinal contours are stable. Improved aeration since the previous exam still with linear opacities at the RIGHT lung base and probable small bilateral pleural fluid effusions. On limited assessment no acute skeletal process. IMPRESSION: 1. Improved aeration since the previous exam still with linear opacities at the RIGHT lung base, likely atelectasis associated with small bilateral pleural effusions. 2. Interval removal of the RIGHT-sided internal jugular vascular sheath and placement of RIGHT-sided PICC line. Electronically Signed   By: August 13, 2020 M.D.   On: 08/12/2020 07:55    Cardiac Studies   Echo 08/06/20: 1. Left ventricular ejection fraction, by estimation, is 25 to 30%. The  left ventricle has severely decreased function. The left ventricle  demonstrates global hypokinesis. Left ventricular diastolic parameters are  consistent with Grade II diastolic  dysfunction (pseudonormalization).  2. Right ventricular systolic function is normal. The right ventricular  size is normal.  3. Left atrial size was mildly dilated.  4. The mitral valve is normal in structure. Mild mitral valve  regurgitation. No evidence of mitral stenosis.  5. The aortic valve is normal in structure. Aortic valve regurgitation is  not visualized. No aortic stenosis is present.  6. The inferior vena cava is normal in size with greater than 50%  respiratory variability, suggesting right atrial pressure of 3 mmHg.   Cardiac cath 08/06/20:  Mid LM lesion is 35% stenosed.  Prox LAD to Mid LAD lesion is 80% stenosed.  1st Diag lesion is 90% stenosed.  Ramus lesion is 60% stenosed.  Prox Cx to Mid Cx lesion is 80% stenosed.  Prox RCA to Mid RCA lesion is 100% stenosed.  There is severe left ventricular systolic dysfunction.  LV end diastolic pressure is mildly elevated.  The left ventricular ejection fraction is less than 25% by visual estimate.   1. Severe 3 vessel obstructive CAD 2.  Severe LV dysfunction. EF estimated at 20-25% 3. Mildly elevated LVEDP   Patient Profile     54 y.o. male with multivessel CAD, smoker/prediabetes, presenting with elevated cardiac enzymes and severely depressed LVEF. Echo with LVEF=25-30%. Cardiac cath with severe three vessel CAD  Assessment & Plan    1. CAD/NSTEMI: POD# 4 s/p CABG. He is hemodynamically stable today. Continue ASA, statin and beta blocker.    2. Ischemic cardiomyopathy: Still with LE edema. IV Lasix today. He is demanding to go home so will need Lasix at home. Continue coreg. Will add Losartan 25 mg po daily. He did not tolerate Entresto at home.  It is presumed that his syncope could have been related to hypotension secondary to [facility].   3. Hx of syncope: May have been related to hypotension during titration of HF meds but with long standing cardiomyopathy (LVEF=30% in march 2021 and now 30% by echo last week) cannot exclude arrhythmia. Will arrange Lifevest prior to discharge.   For questions or updates, please contact [facility] Please consult [email or web address] for contact info under     Signed, Verne Carrow, MD  08/13/2020, 8:55 AM

## 2020-08-13 NOTE — Progress Notes (Addendum)
      [address]       [place] [address]             [phone]      4 Days Post-Op Procedure(s) (LRB): CORONARY ARTERY BYPASS GRAFTING (CABG) x 4, LIMA TO LAD, SVG TO OM1, SVG TO RAMUS, SVG TO PDA (N/A) TRANSESOPHAGEAL ECHOCARDIOGRAM (TEE) (N/A) ENDOVEIN HARVEST OF GREATER SAPHENOUS VEIN (Right)  Subjective:  Up in chair, no complaints.  Again really wants to go home today.  Objective: Vital signs in last 24 hours: Temp:  [98 F (36.7 C)-99.1 F (37.3 C)] 98.4 F (36.9 C) (09/03 0729) Pulse Rate:  [100-110] 100 (09/03 0355) Cardiac Rhythm: Sinus tachycardia (09/02 2315) Resp:  [16-29] 18 (09/03 0729) BP: (100-130)/(66-93) 130/89 (09/03 0729) SpO2:  [94 %-97 %] 96 % (09/03 0355) Weight:  [83.2 kg-85.2 kg] 83.2 kg (09/03 0355)  Intake/Output from previous day: 09/02 0701 - 09/03 0700 In: 491.5 [P.O.:480; I.V.:11.5] Out: 1200 [Urine:1200]  General appearance: alert, cooperative and no distress Heart: regular rate and rhythm Lungs: clear to auscultation bilaterally Abdomen: soft, non-tender; bowel sounds normal; no masses,  no organomegaly Extremities: edema trace Wound: clean and dry  Lab Results: Recent Labs    08/11/20 0455 08/12/20 0515  WBC 9.5 10.1  HGB 10.5* 10.8*  HCT 32.6* 33.5*  PLT 168 180   BMET:  Recent Labs    08/11/20 0455 08/12/20 0515  NA 136 138  K 3.7 3.8  CL 102 104  CO2 25 23  GLUCOSE 97 92  BUN 10 11  CREATININE 0.92 0.85  CALCIUM 8.2* 8.5*    PT/INR: No results for input(s): LABPROT, INR in the last 72 hours. ABG    Component Value Date/Time   PHART 7.331 (L) 08/09/2020 1901   HCO3 21.1 08/09/2020 1901   TCO2 22 08/09/2020 1901   ACIDBASEDEF 4.0 (H) 08/09/2020 1901   O2SAT 90.6 08/12/2020 0515   CBG (last 3)  Recent Labs    08/12/20 0822 08/12/20 1156 08/12/20 1542  GLUCAP 170* 131* 96    Assessment/Plan: S/P Procedure(s) (LRB): CORONARY ARTERY BYPASS GRAFTING (CABG) x 4, LIMA TO LAD, SVG  TO OM1, SVG TO RAMUS, SVG TO PDA (N/A) TRANSESOPHAGEAL ECHOCARDIOGRAM (TEE) (N/A) ENDOVEIN HARVEST OF GREATER SAPHENOUS VEIN (Right)  1. CV- remains in Sinus Tachycardia- continue Coreg 2. Pulm- no acute issues, continue IS 3. Renal- creatinine stable, weight is at baseline.... will give a few case of Lasix 4. Dispo- patient stable, will d/c EPW today, will give short course of diuretics, resume home enstresto, d/c home today... EP to see about life vest.   LOS: 8 days    Lowella Dandy, PA-C  08/13/2020   looks good for DC home DC instructions reviewed with patient  patient examined and medical record reviewed,agree with above note. Kathlee Nations Trigt III 08/13/2020

## 2020-08-13 NOTE — Plan of Care (Signed)
  Problem: Education: Goal: Knowledge of General Education information will improve Description: Including pain rating scale, medication(s)/side effects and non-pharmacologic comfort measures Outcome: Progressing   Problem: Health Behavior/Discharge Planning: Goal: Ability to manage health-related needs will improve Outcome: Progressing   Problem: Clinical Measurements: Goal: Ability to maintain clinical measurements within normal limits will improve Outcome: Progressing Goal: Will remain free from infection Outcome: Progressing   Problem: Activity: Goal: Risk for activity intolerance will decrease Outcome: Progressing   Problem: Elimination: Goal: Will not experience complications related to bowel motility Outcome: Progressing Goal: Will not experience complications related to urinary retention Outcome: Progressing   Problem: Pain Managment: Goal: General experience of comfort will improve Outcome: Progressing

## 2020-08-13 NOTE — Discharge Summary (Addendum)
Physician Discharge Summary  Patient ID: Calvin Rodriguez MRN: [id number] DOB/AGE: 03-21-1966 54 y.o.  Admit date: 08/05/2020 Discharge date: 08/13/2020  Admission Diagnoses:  Patient Active Problem List   Diagnosis Date Noted  . Syncope   . NSTEMI (non-ST elevated myocardial infarction) (HCC) 08/05/2020  . Coronary artery disease 06/10/2020  . Ischemic cardiomyopathy ejection fraction 30 to 35% as per patient 06/10/2020  . Smoking 06/10/2020  . Essential hypertension 06/10/2020   Discharge Diagnoses:   Patient Active Problem List   Diagnosis Date Noted  . S/P CABG x 4 08/09/2020  . Syncope   . NSTEMI (non-ST elevated myocardial infarction) (HCC) 08/05/2020  . Coronary artery disease 06/10/2020  . Ischemic cardiomyopathy ejection fraction 30 to 35% as per patient 06/10/2020  . Smoking 06/10/2020  . Essential hypertension 06/10/2020   Discharged Condition: good  History of Present Illness:  Calvin Rodriguez is a 54 yo male with known history Nicotine abuse, HTN, and CAD.  This dates back 20 years when the patient suffered an MI with subsequent PCI to LAD.  The patient had not been routinely followed since that time.  Prior to presentation the patient suffered 2 syncopal episodes.  He presented to the ED and evaluation showed his Troponin level to be positive.  He underwent Echocardiogram which showed a reduced EF of 25% without evidence of valvular disease.  He was transferred to [facility] for further care.  Hospital Course:  The patient denied chest pain during admission.  He underwent cardiac catheterization on 08/06/2020 which showed 3 vessel CAD.  It was felt coronary bypass grafting would be indicated and TCTS consult was obtained.  The patient was evaluated by Dr. Donata Clay who felt patient would best be treated with coronary bypass grafting.  The risks and benefits of the procedure were explained to the patient and he was agreeable to proceed.  He was taken to the  operating room on 08/09/2020.  He underwent CABG x 4 utilizing LIMA to LAD, SVG to Ramus Intermediate, SVG to OM, and SVG to PDA.  He also underwent Endoscopic harvest of greater saphenous vein from his right leg.  He tolerated the procedure and was transferred to the SICU in stable condition.  He was extubated the evening of surgery without difficulty.  He was weaned off Levophed on POD #1.   He was weaned off Milrinone on 08/12/2020 and Epinephrine 08/11/2020.  His chest tubes were removed without difficulty.  He was started on lasix for hypervolemia.  The patient remained in NSR and was transferred to the progressive care unit in stable condition.  The patient remained clinically stable.  Cardiology consult was requested to see if patient needed life-vest at discharge. He was changed from entresto to losartan by Dr Sanjuana Kava do to intolerance.    His pacing wires were removed without difficulty.  He is ambulating without difficulty.  His surgical incisions are healing without evidence of infection.  He is medically stable for discharge home today.   Consults: cardiology  Significant Diagnostic Studies: angiography:    Mid LM lesion is 35% stenosed.  Prox LAD to Mid LAD lesion is 80% stenosed.  1st Diag lesion is 90% stenosed.  Ramus lesion is 60% stenosed.  Prox Cx to Mid Cx lesion is 80% stenosed.  Prox RCA to Mid RCA lesion is 100% stenosed.  There is severe left ventricular systolic dysfunction.  LV end diastolic pressure is mildly elevated.  The left ventricular ejection fraction is less than  25% by visual estimate.   1. Severe 3 vessel obstructive CAD 2. Severe LV dysfunction. EF estimated at 20-25% 3. Mildly elevated LVEDP  Treatments: surgery:   1.  Coronary artery bypass grafting x4 (left internal mammary artery to left anterior descending, saphenous vein graft to ramus intermediate, saphenous vein graft to the obtuse marginal, saphenous vein graft to posterior descending. 2.    Endoscopic harvest of right leg greater saphenous vein.  Discharge Exam: Blood pressure (!) 123/92, pulse (!) 101, temperature 98.4 F (36.9 C), temperature source Oral, resp. rate 18, height 5\' 9"  (1.753 m), weight 83.2 kg, SpO2 96 %.  General appearance: alert, cooperative and no distress Heart: regular rate and rhythm Lungs: clear to auscultation bilaterally Abdomen: soft, non-tender; bowel sounds normal; no masses,  no organomegaly Extremities: edema trace Wound: clean and dry  Discharge Medications:  The patient has been discharged on:   1.Beta Blocker:  Yes [ X  ]                              No   [   ]                              If No, reason:  2.Ace Inhibitor/ARB: Yes [ X  ]                                     No  [   ]                                     If No, reason:  3.Statin:   Yes [ X  ]                  No                    If No, reason:  4.Ecasa:  Yes   X                   No                     If No, reason:     Discharge Instructions    Amb Referral to Cardiac Rehabilitation   Complete by: As directed    Will refer to CRP II [place]   Diagnosis:  CABG NSTEMI     CABG X ___: 4   After initial evaluation and assessments completed: Virtual Based Care may be provided alone or in conjunction with Phase 2 Cardiac Rehab based on patient barriers.: Yes     Allergies as of 08/13/2020      Reactions   Entresto [sacubitril-valsartan] Other (See Comments)   Severe weakness, fatigue, ? Contributed to syncopal episode      Medication List    STOP taking these medications   Entresto 24-26 MG Generic drug: sacubitril-valsartan     TAKE these medications   acetaminophen 500 MG tablet Commonly known as: TYLENOL Take 1,000 mg by mouth every 6 (six) hours as needed for mild pain.   aspirin EC 81 MG tablet Take 1 tablet (81 mg total) by mouth daily. Swallow whole. What changed: when to take this   carvedilol  3.125 MG tablet Commonly known as:  COREG Take 1 tablet (3.125 mg total) by mouth 2 (two) times daily with a meal.   COQ-10 PO Take 1 capsule by mouth daily.   Fish Oil 1000 MG Caps Take 1,000 mg by mouth in the morning and at bedtime.   furosemide 40 MG tablet Commonly known as: Lasix Take 1 tablet (40 mg total) by mouth daily.   GARLIC PO Take 1 tablet by mouth daily.   guaiFENesin 600 MG 12 hr tablet Commonly known as: MUCINEX Take 1 tablet (600 mg total) by mouth 2 (two) times daily as needed.   losartan 25 MG tablet Commonly known as: COZAAR Take 1 tablet (25 mg total) by mouth daily.   multivitamin with minerals tablet Take 1 tablet by mouth daily.   potassium chloride SA 20 MEQ tablet Commonly known as: KLOR-CON Take 1 tablet (20 mEq total) by mouth daily.   rosuvastatin 40 MG tablet Commonly known as: CRESTOR Take 40 mg by mouth daily.   SOYA LECITHIN PO Take 1 capsule by mouth in the morning and at bedtime.   traMADol 50 MG tablet Commonly known as: ULTRAM Take 1-2 tablets (50-100 mg total) by mouth every 4 (four) hours as needed for moderate pain.   vitamin E 180 MG (400 UNITS) capsule Generic drug: vitamin E Take 400 Units by mouth daily.       Follow-up Information    Georgeanna Lea, MD Follow up on 09/01/2020.   Specialty: Cardiology Why: Please arrive 15 minutes early for your 8:00am post-hospital cardiology follow-up appointment Contact information: [address] [place] [address] [phone]        Kerin Perna, MD. Go on 09/08/2020.   Specialty: Cardiothoracic Surgery Why: Your appointment is at 1:30pm. Please arrive 30 minutes early for a chest x-ray to be performed by [facility] located on the first floor of the same building.  Contact information: [address] [place] [address] [phone]               Signed: Rowe Clack 08/13/2020, 3:55 PM

## 2020-08-20 ENCOUNTER — Telehealth (HOSPITAL_COMMUNITY): Payer: Self-pay

## 2020-08-20 NOTE — Telephone Encounter (Signed)
Faxed cardiac rehab referral to [facility] cardiac rehab phase II. 

## 2020-08-31 ENCOUNTER — Telehealth: Payer: Self-pay | Admitting: *Deleted

## 2020-08-31 NOTE — Telephone Encounter (Signed)
Spoke with [employer] to inform them that pt did not wear monitor. We gave it to pt in office and he never put it on due to heart  surgery. Pt stated would bring it back on 9/22 when comes in to see Dr. Kirtland Bouchard. Need to call [employer] back when pt brings monitor back so they can cancel registration and add monitor back into our inventory.

## 2020-09-01 ENCOUNTER — Other Ambulatory Visit: Payer: Self-pay

## 2020-09-01 ENCOUNTER — Ambulatory Visit: Payer: Commercial Managed Care - PPO | Admitting: Cardiology

## 2020-09-01 ENCOUNTER — Encounter: Payer: Self-pay | Admitting: Cardiology

## 2020-09-01 VITALS — BP 98/70 | HR 90 | Ht 69.0 in | Wt 178.0 lb

## 2020-09-01 DIAGNOSIS — Z951 Presence of aortocoronary bypass graft: Secondary | ICD-10-CM | POA: Diagnosis not present

## 2020-09-01 DIAGNOSIS — I10 Essential (primary) hypertension: Secondary | ICD-10-CM | POA: Diagnosis not present

## 2020-09-01 DIAGNOSIS — I255 Ischemic cardiomyopathy: Secondary | ICD-10-CM

## 2020-09-01 DIAGNOSIS — R55 Syncope and collapse: Secondary | ICD-10-CM

## 2020-09-01 NOTE — Patient Instructions (Signed)
Medication Instructions:  Your physician recommends that you continue on your current medications as directed. Please refer to the Current Medication list given to you today.  *If you need a refill on your cardiac medications before your next appointment, please call your pharmacy*   Lab Work: Your physician recommends that you return for lab work today: bmp, cbc  If you have labs (blood work) drawn today and your tests are completely normal, you will receive your results only by: . MyChart Message (if you have MyChart) OR . A paper copy in the mail If you have any lab test that is abnormal or we need to change your treatment, we will call you to review the results.   Testing/Procedures: None.    Follow-Up: At [facility], you and your health needs are our priority.  As part of our continuing mission to provide you with exceptional heart care, we have created designated Provider Care Teams.  These Care Teams include your primary Cardiologist (physician) and Advanced Practice Providers (APPs -  Physician Assistants and Nurse Practitioners) who all work together to provide you with the care you need, when you need it.  We recommend signing up for the patient portal called "MyChart".  Sign up information is provided on this After Visit Summary.  MyChart is used to connect with patients for Virtual Visits (Telemedicine).  Patients are able to view lab/test results, encounter notes, upcoming appointments, etc.  Non-urgent messages can be sent to your provider as well.   To learn more about what you can do with MyChart, go to [email or web address].    Your next appointment:     Follow up as scheduled.  

## 2020-09-01 NOTE — Addendum Note (Signed)
Addended by: Hazle Quant on: 09/01/2020 08:40 AM   Modules accepted: Orders

## 2020-09-01 NOTE — Telephone Encounter (Signed)
Pt brought monitor back into office unused. Spoke with Irving Burton at [facility] who cancelled registration for this monitor and we can now use it on another pt when needed.

## 2020-09-01 NOTE — Progress Notes (Signed)
Cardiology Office Note:    Date:  09/01/2020   ID:  Brion Aliment IV Ambrose, DOB 1966/03/06, MRN [id number]  PCP:  Galvin Proffer, MD  Cardiologist:  Gypsy Balsam, MD    Referring MD: Galvin Proffer, MD   Chief Complaint  Patient presents with  . Hospitalization Follow-up  Doing well  History of Present Illness:    Calvin Rodriguez is a 54 y.o. male with past medical history significant for coronary artery disease, 21 years ago I treated him for acute anterior wall myocardial infarction, recently presented to my office because of episode of syncope he was found to have significantly diminished left ventricle ejection fraction, troponin I done at that time was significantly elevated, he was brought to [facility] cardiac catheterization was done which showed significant triple-vessel disease, he was also found to have significantly diminished left ventricle ejection fraction 25% range, he was offered coronary artery bypass graft which was done just 2 weeks ago.  LIMA to LAD SVG to ramus intermedius, SVG to obtuse marginal, SVG to PDA.  He is recovering after surgery quite well.  Denies have any chest pain tightness squeezing pressure burning chest.  He was on Levophed as well as milrinone for about a day after procedure.  But overall seems to be recovering well denies having any palpitations, no dizziness no passing out no shortness of breath no swelling of lower extremities.  Because of his low blood pressure he does have some difficulty tolerating medication therefore he is on a very small dose of ARB as well as beta-blocker.  Past Medical History:  Diagnosis Date  . Coronary artery disease   . GERD (gastroesophageal reflux disease)   . Heart attack (HCC) 12/1998    Past Surgical History:  Procedure Laterality Date  . CORONARY ANGIOPLASTY     2000 stent   . CORONARY ARTERY BYPASS GRAFT N/A 08/09/2020   Procedure: CORONARY ARTERY BYPASS GRAFTING (CABG) x 4, LIMA TO LAD, SVG TO  OM1, SVG TO RAMUS, SVG TO PDA;  Surgeon: Donata Clay, Theron Arista, MD;  Location: [facility] OR;  Service: Open Heart Surgery;  Laterality: N/A;  . ENDOVEIN HARVEST OF GREATER SAPHENOUS VEIN Right 08/09/2020   Procedure: ENDOVEIN HARVEST OF GREATER SAPHENOUS VEIN;  Surgeon: Kerin Perna, MD;  Location: [facility] OR;  Service: Open Heart Surgery;  Laterality: Right;  . LEFT HEART CATH AND CORONARY ANGIOGRAPHY N/A 08/06/2020   Procedure: LEFT HEART CATH AND CORONARY ANGIOGRAPHY;  Surgeon: Swaziland, Peter M, MD;  Location: [facility] INVASIVE CV LAB;  Service: Cardiovascular;  Laterality: N/A;  . STENT PLACEMENT VASCULAR ([facility] HX)  12/1998  . TEE WITHOUT CARDIOVERSION N/A 08/09/2020   Procedure: TRANSESOPHAGEAL ECHOCARDIOGRAM (TEE);  Surgeon: Donata Clay, Theron Arista, MD;  Location: [facility] OR;  Service: Open Heart Surgery;  Laterality: N/A;  . TONSILLECTOMY      Current Medications: Current Meds  Medication Sig  . acetaminophen (TYLENOL) 500 MG tablet Take 1,000 mg by mouth every 6 (six) hours as needed for mild pain.  Marland Kitchen aspirin EC 81 MG tablet Take 1 tablet (81 mg total) by mouth daily. Swallow whole.  . carvedilol (COREG) 3.125 MG tablet Take 1 tablet (3.125 mg total) by mouth 2 (two) times daily with a meal.  . Coenzyme Q10 (COQ-10 PO) Take 1 capsule by mouth daily.  . furosemide (LASIX) 40 MG tablet Take 1 tablet (40 mg total) by mouth daily.  Marland Kitchen GARLIC PO Take 1 tablet by mouth daily.  Marland Kitchen guaiFENesin (MUCINEX)  600 MG 12 hr tablet Take 1 tablet (600 mg total) by mouth 2 (two) times daily as needed.  Marland Kitchen losartan (COZAAR) 25 MG tablet Take 1 tablet (25 mg total) by mouth daily.  . Multiple Vitamins-Minerals (MULTIVITAMIN WITH MINERALS) tablet Take 1 tablet by mouth daily.  . Omega-3 Fatty Acids (FISH OIL) 1000 MG CAPS Take 1,000 mg by mouth in the morning and at bedtime.  . potassium chloride SA (KLOR-CON) 20 MEQ tablet Take 1 tablet (20 mEq total) by mouth daily.  Marland Kitchen SOYA LECITHIN PO Take 1 capsule by mouth in the morning and at bedtime.    . vitamin E (VITAMIN E) 180 MG (400 UNITS) capsule Take 400 Units by mouth daily.     Allergies:   Entresto [sacubitril-valsartan]   Social History   Socioeconomic History  . Marital status: Single    Spouse name: Not on file  . Number of children: Not on file  . Years of education: Not on file  . Highest education level: Not on file  Occupational History  . Not on file  Tobacco Use  . Smoking status: Former Smoker    Packs/day: 0.75    Types: Cigarettes    Quit date: 08/05/2020    Years since quitting: 0.0  . Smokeless tobacco: Never Used  Vaping Use  . Vaping Use: Never used  Substance and Sexual Activity  . Alcohol use: Never  . Drug use: Never  . Sexual activity: Not Currently  Other Topics Concern  . Not on file  Social History Narrative  . Not on file   Social Determinants of Health   Financial Resource Strain:   . Difficulty of Paying Living Expenses: Not on file  Food Insecurity:   . Worried About Programme researcher, broadcasting/film/video in the Last Year: Not on file  . Ran Out of Food in the Last Year: Not on file  Transportation Needs:   . Lack of Transportation (Medical): Not on file  . Lack of Transportation (Non-Medical): Not on file  Physical Activity:   . Days of Exercise per Week: Not on file  . Minutes of Exercise per Session: Not on file  Stress:   . Feeling of Stress : Not on file  Social Connections:   . Frequency of Communication with Friends and Family: Not on file  . Frequency of Social Gatherings with Friends and Family: Not on file  . Attends Religious Services: Not on file  . Active Member of Clubs or Organizations: Not on file  . Attends Banker Meetings: Not on file  . Marital Status: Not on file     Family History: The patient's family history includes Heart disease in his father and mother. ROS:   Please see the history of present illness.    All 14 point review of systems negative except as described per history of present  illness  EKGs/Labs/Other Studies Reviewed:      Recent Labs: 08/05/2020: B Natriuretic Peptide 422.9; TSH 1.874 08/06/2020: ALT 27 08/10/2020: Magnesium 2.3 08/13/2020: BUN 10; Creatinine, Ser 0.90; Hemoglobin 11.7; Platelets 265; Potassium 4.0; Sodium 140  Recent Lipid Panel    Component Value Date/Time   CHOL 166 08/06/2020 0639   CHOL 194 06/10/2020 0928   TRIG 87 08/06/2020 0639   HDL 34 (L) 08/06/2020 0639   HDL 32 (L) 06/10/2020 0928   CHOLHDL 4.9 08/06/2020 0639   VLDL 17 08/06/2020 0639   LDLCALC 115 (H) 08/06/2020 0639   LDLCALC 123 (H) 06/10/2020  3419    Physical Exam:    VS:  BP 98/70 (BP Location: Right Arm, Patient Position: Sitting, Cuff Size: Normal)   Pulse 90   Ht 5\' 9"  (1.753 m)   Wt 178 lb (80.7 kg)   SpO2 98%   BMI 26.29 kg/m     Wt Readings from Last 3 Encounters:  09/01/20 178 lb (80.7 kg)  08/13/20 183 lb 6.8 oz (83.2 kg)  08/05/20 183 lb (83 kg)     GEN:  Well nourished, well developed in no acute distress HEENT: Normal NECK: No JVD; No carotid bruits LYMPHATICS: No lymphadenopathy CARDIAC: RRR, no murmurs, no rubs, no gallops RESPIRATORY:  Clear to auscultation without rales, wheezing or rhonchi  ABDOMEN: Soft, non-tender, non-distended MUSCULOSKELETAL:  No edema; No deformity  SKIN: Warm and dry LOWER EXTREMITIES: no swelling NEUROLOGIC:  Alert and oriented x 3 PSYCHIATRIC:  Normal affect   ASSESSMENT:    1. S/P CABG x 4   2. Essential hypertension   3. Ischemic cardiomyopathy ejection fraction 30 to 35% as per patient   4. Syncope, unspecified syncope type    PLAN:    In order of problems listed above:  1. Status post coronary bypass graft wound healing nicely.  He is doing well overall.  I did review the reason why he had the surgery done.  We did review cardiac catheterization as well as description of his coronary artery bypass graft.  He understand that he is happy that he had surgery done. 2. Essential hypertension: Blood  pressure is low we will continue monitoring. 3. Ischemic cardiomyopathy: His ejection fraction is about 25%.  Does not inspect here for ICD yet we need at least 3 months after intervention.  Luckily he is asymptomatic without dizziness or passing out.  I will check his Chem-7 today if Chem-7 is fine will double the dose of losartan.  In the near future we will also double the dose of beta-blocker. 4. Syncope denies having any.  He was evaluated for LifeVest covered it looks like he did not get it. 5. Dyslipidemia: Asking to start Crestor 40. 6. I offer him cardiac rehab he declined.  I will approach him every single time I see him for that.  There clearly benefits of a cardiac rehab program. 7. History of smoking: He quit already.  I encouraged him to stay away from smoking. I will check his Chem-7 today if Chem-7 is fine will double the dose of losartan, also check CBC if CBC is fine I will put him on Plavix.  He need to be on dual antiplatelets therapy for at least 6 months to 1 year. Medication Adjustments/Labs and Tests Ordered: Current medicines are reviewed at length with the patient today.  Concerns regarding medicines are outlined above.  No orders of the defined types were placed in this encounter.  Medication changes: No orders of the defined types were placed in this encounter.   Signed, 08/07/20, MD, [facility] 09/01/2020 8:35 AM    [facility]

## 2020-09-01 NOTE — Addendum Note (Signed)
Addended by: Roosvelt Harps R on: 09/01/2020 11:45 AM   Modules accepted: Orders

## 2020-09-02 LAB — BASIC METABOLIC PANEL
BUN/Creatinine Ratio: 18 (ref 9–20)
BUN: 14 mg/dL (ref 6–24)
CO2: 25 mmol/L (ref 20–29)
Calcium: 10.7 mg/dL — ABNORMAL HIGH (ref 8.7–10.2)
Chloride: 100 mmol/L (ref 96–106)
Creatinine, Ser: 0.78 mg/dL (ref 0.76–1.27)
GFR calc Af Amer: 119 mL/min/{1.73_m2} (ref >59)
GFR calc non Af Amer: 103 mL/min/{1.73_m2} (ref >59)
Glucose: 82 mg/dL (ref 65–99)
Potassium: 5 mmol/L (ref 3.5–5.2)
Sodium: 140 mmol/L (ref 134–144)

## 2020-09-02 LAB — CBC
Hematocrit: 42.7 % (ref 37.5–51.0)
Hemoglobin: 14.1 g/dL (ref 13.0–17.7)
MCH: 30.8 pg (ref 26.6–33.0)
MCHC: 33 g/dL (ref 31.5–35.7)
MCV: 93 fL (ref 79–97)
Platelets: 416 10*3/uL (ref 150–450)
RBC: 4.58 x10E6/uL (ref 4.14–5.80)
RDW: 12.2 % (ref 11.6–15.4)
WBC: 8.3 10*3/uL (ref 3.4–10.8)

## 2020-09-07 ENCOUNTER — Other Ambulatory Visit: Payer: Self-pay | Admitting: Cardiothoracic Surgery

## 2020-09-07 DIAGNOSIS — Z951 Presence of aortocoronary bypass graft: Secondary | ICD-10-CM

## 2020-09-08 ENCOUNTER — Ambulatory Visit (INDEPENDENT_AMBULATORY_CARE_PROVIDER_SITE_OTHER): Payer: Self-pay | Admitting: Cardiothoracic Surgery

## 2020-09-08 ENCOUNTER — Encounter: Payer: Self-pay | Admitting: Cardiothoracic Surgery

## 2020-09-08 ENCOUNTER — Other Ambulatory Visit: Payer: Self-pay

## 2020-09-08 ENCOUNTER — Ambulatory Visit
Admission: RE | Admit: 2020-09-08 | Discharge: 2020-09-08 | Disposition: A | Discharge status: Home / Self Care | Payer: Commercial Managed Care - PPO | Source: Ambulatory Visit | Attending: Cardiothoracic Surgery | Admitting: Cardiothoracic Surgery

## 2020-09-08 VITALS — BP 110/78 | HR 92 | Temp 97.9°F | Resp 18 | Ht 69.0 in | Wt 177.4 lb

## 2020-09-08 DIAGNOSIS — Z951 Presence of aortocoronary bypass graft: Secondary | ICD-10-CM

## 2020-09-08 DIAGNOSIS — Z09 Encounter for follow-up examination after completed treatment for conditions other than malignant neoplasm: Secondary | ICD-10-CM

## 2020-09-08 DIAGNOSIS — I2510 Atherosclerotic heart disease of native coronary artery without angina pectoris: Secondary | ICD-10-CM

## 2020-09-08 HISTORY — DX: Encounter for follow-up examination after completed treatment for conditions other than malignant neoplasm: Z09

## 2020-09-08 NOTE — Progress Notes (Signed)
PCP is Hague, Myrene Galas, MD Referring Provider is [place], Raymie Giammarco M, MD  Chief Complaint  Patient presents with  . Routine Post Op    f/u from surgery with CXR s/p CABG x4, 08/09/20    HPI: Patient returns for first office visit 4 weeks after multivessel CABG for ischemic cardiomyopathy, ejection fraction 25%.  Patient is done extremely well and is walking over an hour per day.  Surgical incisions are healing well and he denies any symptoms of angina or CHF.  He has stopped smoking completely.  Chest x-ray today shows clear lung fields, stable cardiac silhouette and cardiac size and sternal wires intact.  No pleural effusion.  The patient is anxious to start driving resuming normal daily activities.  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  Past Medical History:  Diagnosis Date  . Coronary artery disease   . GERD (gastroesophageal reflux disease)   . Heart attack (HCC) 12/1998    Past Surgical History:  Procedure Laterality Date  . CORONARY ANGIOPLASTY     2000 stent   . CORONARY ARTERY BYPASS GRAFT N/A 08/09/2020   Procedure: CORONARY ARTERY BYPASS GRAFTING (CABG) x 4, LIMA TO LAD, SVG TO OM1, SVG TO RAMUS, SVG TO PDA;  Surgeon: Donata Clay, Theron Arista, MD;  Location: [facility] OR;  Service: Open Heart Surgery;  Laterality: N/A;  . ENDOVEIN HARVEST OF GREATER SAPHENOUS VEIN Right 08/09/2020   Procedure: ENDOVEIN HARVEST OF GREATER SAPHENOUS VEIN;  Surgeon: Kerin Perna, MD;  Location: [facility] OR;  Service: Open Heart Surgery;  Laterality: Right;  . LEFT HEART CATH AND CORONARY ANGIOGRAPHY N/A 08/06/2020   Procedure: LEFT HEART CATH AND CORONARY ANGIOGRAPHY;  Surgeon: [place], Maudine Kluesner M, MD;  Location: [facility] INVASIVE CV LAB;  Service: Cardiovascular;  Laterality: N/A;  . STENT PLACEMENT VASCULAR ([facility] HX)  12/1998  . TEE WITHOUT CARDIOVERSION N/A 08/09/2020   Procedure: TRANSESOPHAGEAL ECHOCARDIOGRAM (TEE);  Surgeon: Donata Clay, Theron Arista, MD;  Location: [facility] OR;   Service: Open Heart Surgery;  Laterality: N/A;  . TONSILLECTOMY      Family History  Problem Relation Age of Onset  . Heart disease Mother   . Heart disease Father     Social History Social History   Tobacco Use  . Smoking status: Former Smoker    Packs/day: 0.75    Types: Cigarettes    Quit date: 08/05/2020    Years since quitting: 0.0  . Smokeless tobacco: Never Used  Vaping Use  . Vaping Use: Never used  Substance Use Topics  . Alcohol use: Never  . Drug use: Never    Current Outpatient Medications  Medication Sig Dispense Refill  . acetaminophen (TYLENOL) 500 MG tablet Take 1,000 mg by mouth every 6 (six) hours as needed for mild pain.    Marland Kitchen aspirin EC 81 MG tablet Take 1 tablet (81 mg total) by mouth daily. Swallow whole. 90 tablet 3  . carvedilol (COREG) 3.125 MG tablet Take 1 tablet (3.125 mg total) by mouth 2 (two) times daily with a meal. 60 tablet 3  . Coenzyme Q10 (COQ-10 PO) Take 1 capsule by mouth daily.    Marland Kitchen GARLIC PO Take 1 tablet by mouth daily.    Marland Kitchen guaiFENesin (MUCINEX) 600 MG 12 hr tablet Take 1 tablet (600 mg total) by mouth 2 (two) times daily as needed.    Marland Kitchen losartan (COZAAR) 25 MG tablet Take 1 tablet (25 mg total) by mouth daily. 30  tablet 1  . Multiple Vitamins-Minerals (MULTIVITAMIN WITH MINERALS) tablet Take 1 tablet by mouth daily.    . Omega-3 Fatty Acids (FISH OIL) 1000 MG CAPS Take 1,000 mg by mouth in the morning and at bedtime.    Marland Kitchen SOYA LECITHIN PO Take 1 capsule by mouth in the morning and at bedtime.    . vitamin E (VITAMIN E) 180 MG (400 UNITS) capsule Take 400 Units by mouth daily.    . furosemide (LASIX) 40 MG tablet Take 1 tablet (40 mg total) by mouth daily. (Patient not taking: Reported on 09/08/2020) 7 tablet 0  . potassium chloride SA (KLOR-CON) 20 MEQ tablet Take 1 tablet (20 mEq total) by mouth daily. (Patient not taking: Reported on 09/08/2020) 7 tablet 0   No current facility-administered medications for this visit.     Allergies  Allergen Reactions  . Entresto [Sacubitril-Valsartan] Other (See Comments)    Severe weakness, fatigue, ? Contributed to syncopal episode    Review of Systems  Weight stable No fever No edema Has not required any pain medicine for 2 weeks  BP 110/78   Pulse 92   Temp 97.9 F (36.6 C)   Resp 18   Ht 5\' 9"  (1.753 m)   Wt 177 lb 6.4 oz (80.5 kg)   SpO2 96% Comment: RA  BMI 26.20 kg/m  Physical Exam      Exam    General- alert and comfortable.  Sternal and leg incisions well-healed.    Neck- no JVD, no cervical adenopathy palpable, no carotid bruit   Lungs- clear without rales, wheezes   Cor- regular rate and rhythm, no murmur , gallop   Abdomen- soft, non-tender   Extremities - warm, non-tender, minimal edema   Neuro- oriented, appropriate, no focal weakness   Diagnostic Tests: Today's chest x-ray personally reviewed showing clear lung fields, sternal wires intact  Impression: Excellent early recovery 4 weeks after urgent CABG for ischemic cardiomyopathy. Patient understands he can now lift up to 10 pounds drive and do normal activities.  He was given a sheet to return to work in 4 weeks without having to lift more than 10 pounds.  He understands he should continue with heart healthy diet and regular exercise with ambulation of the least 40 minutes daily-continue current medications. Plan: Return for follow-up in 6 to 8 weeks to review progress.   , MD [facility] [phone]

## 2020-10-05 DIAGNOSIS — K219 Gastro-esophageal reflux disease without esophagitis: Secondary | ICD-10-CM | POA: Insufficient documentation

## 2020-10-07 ENCOUNTER — Ambulatory Visit (INDEPENDENT_AMBULATORY_CARE_PROVIDER_SITE_OTHER): Payer: Commercial Managed Care - PPO | Admitting: Cardiology

## 2020-10-07 ENCOUNTER — Other Ambulatory Visit: Payer: Self-pay

## 2020-10-07 ENCOUNTER — Encounter: Payer: Self-pay | Admitting: Cardiology

## 2020-10-07 VITALS — BP 124/80 | HR 80 | Ht 69.0 in | Wt 185.6 lb

## 2020-10-07 DIAGNOSIS — Z951 Presence of aortocoronary bypass graft: Secondary | ICD-10-CM

## 2020-10-07 DIAGNOSIS — I10 Essential (primary) hypertension: Secondary | ICD-10-CM

## 2020-10-07 DIAGNOSIS — F17200 Nicotine dependence, unspecified, uncomplicated: Secondary | ICD-10-CM

## 2020-10-07 DIAGNOSIS — I255 Ischemic cardiomyopathy: Secondary | ICD-10-CM

## 2020-10-07 LAB — BASIC METABOLIC PANEL
BUN/Creatinine Ratio: 12 (ref 9–20)
BUN: 11 mg/dL (ref 6–24)
CO2: 23 mmol/L (ref 20–29)
Calcium: 9.9 mg/dL (ref 8.7–10.2)
Chloride: 103 mmol/L (ref 96–106)
Creatinine, Ser: 0.9 mg/dL (ref 0.76–1.27)
GFR calc Af Amer: 112 mL/min/{1.73_m2} (ref >59)
GFR calc non Af Amer: 97 mL/min/{1.73_m2} (ref >59)
Glucose: 85 mg/dL (ref 65–99)
Potassium: 4.5 mmol/L (ref 3.5–5.2)
Sodium: 140 mmol/L (ref 134–144)

## 2020-10-07 MED ORDER — CARVEDILOL 6.25 MG PO TABS
6.2500 mg | ORAL_TABLET | Freq: Two times a day (BID) | ORAL | 2 refills | Status: DC
Start: 2020-10-07 — End: 2021-01-14

## 2020-10-07 MED ORDER — CLOPIDOGREL BISULFATE 75 MG PO TABS: 75.0000 mg | ORAL_TABLET | Freq: Every day | ORAL | 1 refills | Status: DC

## 2020-10-07 NOTE — Progress Notes (Signed)
Cardiology Office Note:    Date:  10/07/2020   ID:  Calvin Rodriguez, DOB 05-17-1966, MRN [id number]  PCP:  Galvin Proffer, MD  Cardiologist:  Gypsy Balsam, MD    Referring MD: Galvin Proffer, MD   No chief complaint on file. I am doing very well  History of Present Illness:    Calvin Rodriguez is a 54 y.o. male  with past medical history significant for coronary artery disease, 21 years ago I treated him for acute anterior wall myocardial infarction, recently presented to my office because of episode of syncope he was found to have significantly diminished left ventricle ejection fraction, troponin I done at that time was significantly elevated, he was brought to [facility] cardiac catheterization was done which showed significant triple-vessel disease, he was also found to have significantly diminished left ventricle ejection fraction 25% range, he was offered coronary artery bypass graft which was done just 2 weeks ago.  LIMA to LAD SVG to ramus intermedius, SVG to obtuse marginal, SVG to PDA.  He is recovering after surgery quite well.  Comes today 2 months for follow-up overall doing great.  He started walking on the regular basis does not have any chest pain tightness squeezing pressure burning chest no shortness of breath he already tells me he feels significantly better.  No swelling of lower extremities no palpitations no dizziness no passing out.  Past Medical History:  Diagnosis Date  . Coronary artery disease   . Essential hypertension 06/10/2020  . GERD (gastroesophageal reflux disease)   . Heart attack (HCC) 12/1998  . Ischemic cardiomyopathy ejection fraction 30 to 35% as per patient 06/10/2020  . NSTEMI (non-ST elevated myocardial infarction) (HCC) 08/05/2020  . Postop check 09/08/2020  . S/P CABG x 4LIMA - LAD, SVG-RI, SVG-OM, SVG-PDA 2021 08/09/2020  . Smoking 06/10/2020  . Syncope     Past Surgical History:  Procedure Laterality Date  . CORONARY ANGIOPLASTY      2000 stent   . CORONARY ARTERY BYPASS GRAFT N/A 08/09/2020   Procedure: CORONARY ARTERY BYPASS GRAFTING (CABG) x 4, LIMA TO LAD, SVG TO OM1, SVG TO RAMUS, SVG TO PDA;  Surgeon: Donata Clay, Theron Arista, MD;  Location: [facility] OR;  Service: Open Heart Surgery;  Laterality: N/A;  . ENDOVEIN HARVEST OF GREATER SAPHENOUS VEIN Right 08/09/2020   Procedure: ENDOVEIN HARVEST OF GREATER SAPHENOUS VEIN;  Surgeon: Kerin Perna, MD;  Location: [facility] OR;  Service: Open Heart Surgery;  Laterality: Right;  . LEFT HEART CATH AND CORONARY ANGIOGRAPHY N/A 08/06/2020   Procedure: LEFT HEART CATH AND CORONARY ANGIOGRAPHY;  Surgeon: Swaziland, Peter M, MD;  Location: [facility] INVASIVE CV LAB;  Service: Cardiovascular;  Laterality: N/A;  . STENT PLACEMENT VASCULAR ([facility] HX)  12/1998  . TEE WITHOUT CARDIOVERSION N/A 08/09/2020   Procedure: TRANSESOPHAGEAL ECHOCARDIOGRAM (TEE);  Surgeon: Donata Clay, Theron Arista, MD;  Location: [facility] OR;  Service: Open Heart Surgery;  Laterality: N/A;  . TONSILLECTOMY      Current Medications: Current Meds  Medication Sig  . acetaminophen (TYLENOL) 500 MG tablet Take 1,000 mg by mouth every 6 (six) hours as needed for mild pain.  Marland Kitchen aspirin EC 81 MG tablet Take 1 tablet (81 mg total) by mouth daily. Swallow whole.  . carvedilol (COREG) 3.125 MG tablet Take 1 tablet (3.125 mg total) by mouth 2 (two) times daily with a meal.  . Coenzyme Q10 (COQ-10 PO) Take 1 capsule by mouth daily.  Marland Kitchen GARLIC PO Take  1 tablet by mouth daily.  Marland Kitchen guaiFENesin (MUCINEX) 600 MG 12 hr tablet Take 1 tablet (600 mg total) by mouth 2 (two) times daily as needed.  Marland Kitchen losartan (COZAAR) 25 MG tablet Take 1 tablet (25 mg total) by mouth daily.  . Multiple Vitamins-Minerals (MULTIVITAMIN WITH MINERALS) tablet Take 1 tablet by mouth daily.  . Omega-3 Fatty Acids (FISH OIL) 1000 MG CAPS Take 1,000 mg by mouth in the morning and at bedtime.  . potassium chloride SA (KLOR-CON) 20 MEQ tablet Take 1 tablet (20 mEq total) by mouth daily.  Marland Kitchen SOYA  LECITHIN PO Take 1 capsule by mouth in the morning and at bedtime.  . vitamin E (VITAMIN E) 180 MG (400 UNITS) capsule Take 400 Units by mouth daily.     Allergies:   Entresto [sacubitril-valsartan]   Social History   Socioeconomic History  . Marital status: Single    Spouse name: Not on file  . Number of children: Not on file  . Years of education: Not on file  . Highest education level: Not on file  Occupational History  . Not on file  Tobacco Use  . Smoking status: Former Smoker    Packs/day: 0.75    Types: Cigarettes    Quit date: 08/05/2020    Years since quitting: 0.1  . Smokeless tobacco: Never Used  Vaping Use  . Vaping Use: Never used  Substance and Sexual Activity  . Alcohol use: Never  . Drug use: Never  . Sexual activity: Not Currently  Other Topics Concern  . Not on file  Social History Narrative  . Not on file   Social Determinants of Health   Financial Resource Strain:   . Difficulty of Paying Living Expenses: Not on file  Food Insecurity:   . Worried About Programme researcher, broadcasting/film/video in the Last Year: Not on file  . Ran Out of Food in the Last Year: Not on file  Transportation Needs:   . Lack of Transportation (Medical): Not on file  . Lack of Transportation (Non-Medical): Not on file  Physical Activity:   . Days of Exercise per Week: Not on file  . Minutes of Exercise per Session: Not on file  Stress:   . Feeling of Stress : Not on file  Social Connections:   . Frequency of Communication with Friends and Family: Not on file  . Frequency of Social Gatherings with Friends and Family: Not on file  . Attends Religious Services: Not on file  . Active Member of Clubs or Organizations: Not on file  . Attends Banker Meetings: Not on file  . Marital Status: Not on file     Family History: The patient's family history includes Heart disease in his father and mother. ROS:   Please see the history of present illness.    All 14 point review of  systems negative except as described per history of present illness  EKGs/Labs/Other Studies Reviewed:      Recent Labs: 08/05/2020: B Natriuretic Peptide 422.9; TSH 1.874 08/06/2020: ALT 27 08/10/2020: Magnesium 2.3 09/01/2020: BUN 14; Creatinine, Ser 0.78; Hemoglobin 14.1; Platelets 416; Potassium 5.0; Sodium 140  Recent Lipid Panel    Component Value Date/Time   CHOL 166 08/06/2020 0639   CHOL 194 06/10/2020 0928   TRIG 87 08/06/2020 0639   HDL 34 (L) 08/06/2020 0639   HDL 32 (L) 06/10/2020 0928   CHOLHDL 4.9 08/06/2020 0639   VLDL 17 08/06/2020 0639   LDLCALC 115 (H)  08/06/2020 0639   LDLCALC 123 (H) 06/10/2020 9379    Physical Exam:    VS:  BP 124/80   Pulse 80   Ht 5\' 9"  (1.753 m)   Wt 185 lb 9.6 oz (84.2 kg)   SpO2 98%   BMI 27.41 kg/m     Wt Readings from Last 3 Encounters:  10/07/20 185 lb 9.6 oz (84.2 kg)  09/08/20 177 lb 6.4 oz (80.5 kg)  09/01/20 178 lb (80.7 kg)     GEN:  Well nourished, well developed in no acute distress HEENT: Normal NECK: No JVD; No carotid bruits LYMPHATICS: No lymphadenopathy CARDIAC: RRR, no murmurs, no rubs, no gallops RESPIRATORY:  Clear to auscultation without rales, wheezing or rhonchi  ABDOMEN: Soft, non-tender, non-distended MUSCULOSKELETAL:  No edema; No deformity  SKIN: Warm and dry LOWER EXTREMITIES: no swelling NEUROLOGIC:  Alert and oriented x 3 PSYCHIATRIC:  Normal affect   ASSESSMENT:    1. S/P CABG x 4LIMA - LAD, SVG-RI, SVG-OM, SVG-PDA 2021   2. Smoking   3. Essential hypertension   4. Ischemic cardiomyopathy ejection fraction 30 to 35% as per patient    PLAN:    In order of problems listed above:  1. Status post coronary bypass graft recovering excellently.  We will continue present management.  Will add Plavix to his medical regimen. 2. Smoking: He does not smoke we discussed the issue that he should not return to smoking.  He understand and he will continue without it. 3. Essential hypertension his  blood pressure actually is fine.  Continue present management. 4. Ischemic cardiomyopathy: I will ask you to have EKG done today if EKG is fine will double the dose of Coreg, also ask you to get Chem-7 if Chem-7 is fine will double the dose of losartan.  I see him back in about 3 months but I will do echocardiogram in about 2 months.  I did review note by surgeon for this visit   Medication Adjustments/Labs and Tests Ordered: Current medicines are reviewed at length with the patient today.  Concerns regarding medicines are outlined above.  No orders of the defined types were placed in this encounter.  Medication changes: No orders of the defined types were placed in this encounter.   Signed, 2022, MD, [facility] 10/07/2020 8:19 AM    [facility]

## 2020-10-07 NOTE — Patient Instructions (Signed)
Medication Instructions:  Your physician has recommended you make the following change in your medication:   START: Plavix 75 mg daily   INCREASE: Carvedilol 6.25 mg twice daily   *If you need a refill on your cardiac medications before your next appointment, please call your pharmacy*   Lab Work: Your physician recommends that you return for lab work today: bmp   If you have labs (blood work) drawn today and your tests are completely normal, you will receive your results only by: Marland Kitchen MyChart Message (if you have MyChart) OR . A paper copy in the mail If you have any lab test that is abnormal or we need to change your treatment, we will call you to review the results.   Testing/Procedures: Your physician has requested that you have an echocardiogram. Echocardiography is a painless test that uses sound waves to create images of your heart. It provides your doctor with information about the size and shape of your heart and how well your heart's chambers and valves are working. This procedure takes approximately one hour. There are no restrictions for this procedure.     Follow-Up: At [facility], you and your health needs are our priority.  As part of our continuing mission to provide you with exceptional heart care, we have created designated Provider Care Teams.  These Care Teams include your primary Cardiologist (physician) and Advanced Practice Providers (APPs -  Physician Assistants and Nurse Practitioners) who all work together to provide you with the care you need, when you need it.  We recommend signing up for the patient portal called "MyChart".  Sign up information is provided on this After Visit Summary.  MyChart is used to connect with patients for Virtual Visits (Telemedicine).  Patients are able to view lab/test results, encounter notes, upcoming appointments, etc.  Non-urgent messages can be sent to your provider as well.   To learn more about what you can do with MyChart,  go to [email or web address].    Your next appointment:   3 month(s)  The format for your next appointment:   In Person  Provider:   Gypsy Balsam, MD   Other Instructions  Carvedilol Tablets What is this medicine? CARVEDILOL (KAR ve dil ol) is a beta blocker. It decreases the amount of work your heart has to do and helps your heart beat regularly. It treats high blood pressure. This medicine may be used for other purposes; ask your health care provider or pharmacist if you have questions. COMMON BRAND NAME(S): Coreg What should I tell my health care provider before I take this medicine? They need to know if you have any of these conditions:  circulation problems  diabetes  history of heart attack or heart disease  liver disease  lung or breathing disease, like asthma or emphysema  pheochromocytoma  slow or irregular heartbeat  thyroid disease  an unusual or allergic reaction to carvedilol, other beta-blockers, medicines, foods, dyes, or preservatives  pregnant or trying to get pregnant  breast-feeding How should I use this medicine? Take this drug by mouth. Take it as directed on the prescription label at the same time every day. Take it with food. Keep taking it unless your health care provider tells you to stop. Talk to your health care provider about the use of this drug in children. Special care may be needed. Overdosage: If you think you have taken too much of this medicine contact a poison control center or emergency room at once. NOTE: This medicine is  only for you. Do not share this medicine with others. What if I miss a dose? If you miss a dose, take it as soon as you can. If it is almost time for your next dose, take only that dose. Do not take double or extra doses. What may interact with this medicine? This medicine may interact with the following medications:  certain medicines for blood pressure, heart disease, irregular heart beat  certain  medicines for depression, like fluoxetine or paroxetine  certain medicines for diabetes, like glipizide or glyburide  cimetidine  clonidine  cyclosporine  digoxin  MAOIs like Carbex, Eldepryl, Marplan, Nardil, and Parnate  reserpine  rifampin This list may not describe all possible interactions. Give your health care provider a list of all the medicines, herbs, non-prescription drugs, or dietary supplements you use. Also tell them if you smoke, drink alcohol, or use illegal drugs. Some items may interact with your medicine. What should I watch for while using this medicine? Check your heart rate and blood pressure regularly while you are taking this medicine. Ask your doctor or health care professional what your heart rate and blood pressure should be, and when you should contact him or her. Do not stop taking this medicine suddenly. This could lead to serious heart-related effects. Contact your doctor or health care professional if you have difficulty breathing while taking this drug. Check your weight daily. Ask your doctor or health care professional when you should notify him/her of any weight gain. You may get drowsy or dizzy. Do not drive, use machinery, or do anything that requires mental alertness until you know how this medicine affects you. To reduce the risk of dizzy or fainting spells, do not sit or stand up quickly. Alcohol can make you more drowsy, and increase flushing and rapid heartbeats. Avoid alcoholic drinks. This medicine may increase blood sugar. Ask your healthcare provider if changes in diet or medicines are needed if you have diabetes. If you are going to have surgery, tell your doctor or health care professional that you are taking this medicine. What side effects may I notice from receiving this medicine? Side effects that you should report to your doctor or health care professional as soon as possible:  allergic reactions like skin rash, itching or hives,  swelling of the face, lips, or tongue  breathing problems  dark urine  irregular heartbeat   signs and symptoms of high blood sugar such as being more thirsty or hungry or having to urinate more than normal. You may also feel very tired or have blurry vision.  swollen legs or ankles  vomiting  yellowing of the eyes or skin Side effects that usually do not require medical attention (report to your doctor or health care professional if they continue or are bothersome):  change in sex drive or performance  diarrhea  dry eyes (especially if wearing contact lenses)  dry, itching skin  headache  nausea  unusually tired This list may not describe all possible side effects. Call your doctor for medical advice about side effects. You may report side effects to FDA at 1-800-FDA-1088. Where should I keep my medicine? Keep out of the reach of children and pets. Store at room temperature between 20 and 25 degrees C (68 and 77 degrees F). Protect from moisture. Keep the container tightly closed. Throw away any unused drug after the expiration date. NOTE: This sheet is a summary. It may not cover all possible information. If you have questions about this medicine,  talk to your doctor, pharmacist, or health care provider.  2020 Elsevier/Gold Standard (2019-07-04 17:42:09)  Clopidogrel tablets What is this medicine? CLOPIDOGREL (kloh PID oh grel) helps to prevent blood clots. This medicine is used to prevent heart attack, stroke, or other vascular events in people who are at high risk. This medicine may be used for other purposes; ask your health care provider or pharmacist if you have questions. COMMON BRAND NAME(S): Plavix What should I tell my health care provider before I take this medicine? They need to know if you have any of the following conditions:  bleeding disorders  bleeding in the brain  having surgery  history of stomach bleeding  an unusual or allergic reaction to  clopidogrel, other medicines, foods, dyes, or preservatives  pregnant or trying to get pregnant  breast-feeding How should I use this medicine? Take this medicine by mouth with a glass of water. Follow the directions on the prescription label. You may take this medicine with or without food. If it upsets your stomach, take it with food. Take your medicine at regular intervals. Do not take it more often than directed. Do not stop taking except on your doctor's advice. A special MedGuide will be given to you by the pharmacist with each prescription and refill. Be sure to read this information carefully each time. Talk to your pediatrician regarding the use of this medicine in children. Special care may be needed. Overdosage: If you think you have taken too much of this medicine contact a poison control center or emergency room at once. NOTE: This medicine is only for you. Do not share this medicine with others. What if I miss a dose? If you miss a dose, take it as soon as you can. If it is almost time for your next dose, take only that dose. Do not take double or extra doses. What may interact with this medicine? Do not take this medicine with the following medications:  dasabuvir; ombitasvir; paritaprevir; ritonavir  defibrotide  selexipag This medicine may also interact with the following medications:  certain medicines that treat or prevent blood clots like warfarin  narcotic medicines for pain  NSAIDs, medicines for pain and inflammation, like ibuprofen or naproxen  repaglinide  SNRIs, medicines for depression, like desvenlafaxine, duloxetine, levomilnacipran, venlafaxine  SSRIs, medicines for depression, like citalopram, escitalopram, fluoxetine, fluvoxamine, paroxetine, sertraline  stomach acid blockers like cimetidine, esomeprazole, omeprazole This list may not describe all possible interactions. Give your health care provider a list of all the medicines, herbs,  non-prescription drugs, or dietary supplements you use. Also tell them if you smoke, drink alcohol, or use illegal drugs. Some items may interact with your medicine. What should I watch for while using this medicine? Visit your doctor or health care professional for regular check-ups. Do not stop taking your medicine unless your doctor tells you to. Notify your doctor or health care professional and seek emergency treatment if you develop breathing problems; changes in vision; chest pain; severe, sudden headache; pain, swelling, warmth in the leg; trouble speaking; sudden numbness or weakness of the face, arm or leg. These can be signs that your condition has gotten worse. If you are going to have surgery or dental work, tell your doctor or health care professional that you are taking this medicine. Certain genetic factors may reduce the effect of this medicine. Your doctor may use genetic tests to determine treatment. Only take aspirin if you are instructed to. Low doses of aspirin are used with this  medicine to treat some conditions. Taking aspirin with this medicine can increase your risk of bleeding so you must be careful. Talk to your doctor or pharmacist if you have questions. What side effects may I notice from receiving this medicine? Side effects that you should report to your doctor or health care professional as soon as possible:  allergic reactions like skin rash, itching or hives, swelling of the face, lips, or tongue  signs and symptoms of bleeding such as bloody or black, tarry stools; red or dark-brown urine; spitting up blood or brown material that looks like coffee grounds; red spots on the skin; unusual bruising or bleeding from the eye, gums, or nose  signs and symptoms of a blood clot such as breathing problems; changes in vision; chest pain; severe, sudden headache; pain, swelling, warmth in the leg; trouble speaking; sudden numbness or weakness of the face, arm or leg  signs  and symptoms of low blood sugar such as feeling anxious; confusion; dizziness; increased hunger; unusually weak or tired; increased sweating; shakiness; cold, clammy skin; irritable; headache; blurred vision; fast heartbeat; loss of consciousness Side effects that usually do not require medical attention (report to your doctor or health care professional if they continue or are bothersome):  constipation  diarrhea  headache  upset stomach This list may not describe all possible side effects. Call your doctor for medical advice about side effects. You may report side effects to FDA at 1-800-FDA-1088. Where should I keep my medicine? Keep out of the reach of children. Store at room temperature of 59 to 86 degrees F (15 to 30 degrees C). Throw away any unused medicine after the expiration date. NOTE: This sheet is a summary. It may not cover all possible information. If you have questions about this medicine, talk to your doctor, pharmacist, or health care provider.  2020 Elsevier/Gold Standard (2018-04-29 15:03:38)   Echocardiogram An echocardiogram is a procedure that uses painless sound waves (ultrasound) to produce an image of the heart. Images from an echocardiogram can provide important information about:  Signs of coronary artery disease (CAD).  Aneurysm detection. An aneurysm is a weak or damaged part of an artery wall that bulges out from the normal force of blood pumping through the body.  Heart size and shape. Changes in the size or shape of the heart can be associated with certain conditions, including heart failure, aneurysm, and CAD.  Heart muscle function.  Heart valve function.  Signs of a past heart attack.  Fluid buildup around the heart.  Thickening of the heart muscle.  A tumor or infectious growth around the heart valves. Tell a health care provider about:  Any allergies you have.  All medicines you are taking, including vitamins, herbs, eye drops, creams,  and over-the-counter medicines.  Any blood disorders you have.  Any surgeries you have had.  Any medical conditions you have.  Whether you are pregnant or may be pregnant. What are the risks? Generally, this is a safe procedure. However, problems may occur, including:  Allergic reaction to dye (contrast) that may be used during the procedure. What happens before the procedure? No specific preparation is needed. You may eat and drink normally. What happens during the procedure?   An IV tube may be inserted into one of your veins.  You may receive contrast through this tube. A contrast is an injection that improves the quality of the pictures from your heart.  A gel will be applied to your chest.  A  wand-like tool (transducer) will be moved over your chest. The gel will help to transmit the sound waves from the transducer.  The sound waves will harmlessly bounce off of your heart to allow the heart images to be captured in real-time motion. The images will be recorded on a computer. The procedure may vary among health care providers and hospitals. What happens after the procedure?  You may return to your normal, everyday life, including diet, activities, and medicines, unless your health care provider tells you not to do that. Summary  An echocardiogram is a procedure that uses painless sound waves (ultrasound) to produce an image of the heart.  Images from an echocardiogram can provide important information about the size and shape of your heart, heart muscle function, heart valve function, and fluid buildup around your heart.  You do not need to do anything to prepare before this procedure. You may eat and drink normally.  After the echocardiogram is completed, you may return to your normal, everyday life, unless your health care provider tells you not to do that. This information is not intended to replace advice given to you by your health care provider. Make sure you  discuss any questions you have with your health care provider. Document Revised: 03/20/2019 Document Reviewed: 12/30/2016 Elsevier Patient Education  2020 [employer].

## 2020-10-07 NOTE — Addendum Note (Signed)
Addended by: Hazle Quant on: 10/07/2020 08:39 AM   Modules accepted: Orders

## 2020-10-07 NOTE — Addendum Note (Signed)
Addended by: Roddie Mc on: 10/07/2020 08:33 AM   Modules accepted: Orders

## 2020-10-12 ENCOUNTER — Telehealth: Payer: Self-pay | Admitting: Emergency Medicine

## 2020-10-12 DIAGNOSIS — Z79899 Other long term (current) drug therapy: Secondary | ICD-10-CM

## 2020-10-12 MED ORDER — LOSARTAN POTASSIUM 25 MG PO TABS: 25.0000 mg | ORAL_TABLET | Freq: Two times a day (BID) | ORAL | 2 refills | Status: DC

## 2020-10-12 NOTE — Telephone Encounter (Signed)
-----   Message from Georgeanna Lea, MD sent at 10/12/2020 11:42 AM EDT ----- His Chem-7 is fine, please double the dose of losartan, to 25 twice daily, he is to have Chem-7 done within the next 10 days

## 2020-10-21 ENCOUNTER — Telehealth: Payer: Self-pay | Admitting: Cardiology

## 2020-10-21 NOTE — Telephone Encounter (Signed)
Will be nice to continue with 6.25 he really got significant cardiomyopathy and actually he did have history of V. tach that is why it is important to keep higher dose of course if he cannot tolerate it then will be forced to reduce to 3.25.  For now please continue 6.25 twice daily

## 2020-10-21 NOTE — Telephone Encounter (Signed)
He need to be taking Plavix.  I will talk to him during next visit about all medications.  He does have significant cardiomyopathy

## 2020-10-21 NOTE — Telephone Encounter (Signed)
Reviewed recommendation by Dr. Bing Matter to continue Plavix. Patient politely refuses to take Plavix and tells me he will discuss with Dr. Bing Matter at his next follow up. He will continue Coreg 6.25mg  BID.   He has echo scheduled 11/29/20. He was under the impression that it was scheduled for 11/08/20, do not see that it was ever schedule for this date. He will check his calendar at home and call or send Mychart message if 11/29/20 does not work for him.   Alver Sorrow, NP

## 2020-10-21 NOTE — Telephone Encounter (Signed)
Pt c/o medication issue:  1. Name of Medication: clopidogrel (PLAVIX) 75 MG tablet  2. How are you currently taking this medication (dosage and times per day)? 1 tablet daily  3. Are you having a reaction (difficulty breathing--STAT)? no  4. What is your medication issue? Patient states the medication has been making him excessively tired. He states 3 times in a row he feel asleep at the dinner table. He states he is afraid this could happen while at a stop light and does not want to continue taking the medication.

## 2020-10-21 NOTE — Telephone Encounter (Signed)
Will advise him of such. With low normal BP and fatigue would you like him to continue Coreg 6.25mg  BID or reduce back to 3.125mg  BID?   Thanks!

## 2020-10-21 NOTE — Telephone Encounter (Signed)
Spoke with Calvin Rodriguez.  He was started on Plavix 75 daily at last clinic visit 10/07/20 and his Carvedilol was increased to 6.25mg  BID. He was also recommended to increase Losartan from 12.5mg  to 25mg  - however, he tells me he has not been taking his Losartan since hospital discharge as his BP at home is routinely 110s/70s when checked with arm cuff. Does not check HR at home.   Tells me since starting the Plavix he has noted extreme fatigue and is worried he will fall asleep at a stop light while driving. Tells me he does not have energy to go on the walks he was doing just two weeks ago. We discussed that this is not a common side effect of Plavix and is potentially related to his Carvedilol.   Reports no chest pain, shortness of breath. No lightheadedness, dizziness, near syncope, syncope.   He has held his Plavix this morning. Told him we would call him back with recommendations from Dr. and he was appreciative of the call.   Bing Matter, NP

## 2020-10-27 ENCOUNTER — Other Ambulatory Visit: Payer: Self-pay

## 2020-10-27 ENCOUNTER — Ambulatory Visit (INDEPENDENT_AMBULATORY_CARE_PROVIDER_SITE_OTHER): Payer: Self-pay | Admitting: Cardiothoracic Surgery

## 2020-10-27 ENCOUNTER — Encounter: Payer: Self-pay | Admitting: Cardiothoracic Surgery

## 2020-10-27 VITALS — BP 111/75 | HR 97 | Resp 18 | Ht 69.0 in | Wt 190.0 lb

## 2020-10-27 DIAGNOSIS — Z951 Presence of aortocoronary bypass graft: Secondary | ICD-10-CM

## 2020-10-27 NOTE — Progress Notes (Signed)
PCP is Hague, Myrene Galas, MD Referring Provider is [place], Arlyn Buerkle M, MD  Chief Complaint  Patient presents with  . Routine Post Op    s/p CABG 07/13/20    HPI: Final postop check 12 weeks after urgent CABG x4 when the patient presented with unstable angina, low EF 25% and severe three-vessel CAD.  Patient is recovered from surgery very well.  No symptoms of angina or CHF.  Surgical incisions well-healed.  Minimal pain.  The patient is back working his pre-operative job at [employer].  He knows to avoid lifting more than 10 pounds until December and more than when he pounds until February 1. He understands importance of diet and exercise for heart healthy lifestyle.  He understands importance of long-term Plavix with aspirin and Crestor for patency of his bypass graft.  Past Medical History:  Diagnosis Date  . Coronary artery disease   . Essential hypertension 06/10/2020  . GERD (gastroesophageal reflux disease)   . Heart attack (HCC) 12/1998  . Ischemic cardiomyopathy ejection fraction 30 to 35% as per patient 06/10/2020  . NSTEMI (non-ST elevated myocardial infarction) (HCC) 08/05/2020  . Postop check 09/08/2020  . S/P CABG x 4LIMA - LAD, SVG-RI, SVG-OM, SVG-PDA 2021 08/09/2020  . Smoking 06/10/2020  . Syncope     Past Surgical History:  Procedure Laterality Date  . CORONARY ANGIOPLASTY     2000 stent   . CORONARY ARTERY BYPASS GRAFT N/A 08/09/2020   Procedure: CORONARY ARTERY BYPASS GRAFTING (CABG) x 4, LIMA TO LAD, SVG TO OM1, SVG TO RAMUS, SVG TO PDA;  Surgeon: Donata Clay, Theron Arista, MD;  Location: [facility] OR;  Service: Open Heart Surgery;  Laterality: N/A;  . ENDOVEIN HARVEST OF GREATER SAPHENOUS VEIN Right 08/09/2020   Procedure: ENDOVEIN HARVEST OF GREATER SAPHENOUS VEIN;  Surgeon: Kerin Perna, MD;  Location: [facility] OR;  Service: Open Heart Surgery;  Laterality: Right;  . LEFT HEART CATH AND CORONARY ANGIOGRAPHY N/A 08/06/2020   Procedure: LEFT HEART CATH AND CORONARY ANGIOGRAPHY;  Surgeon:  Sw[place], Tamma Brigandi M, MD;  Location: [facility] INVASIVE CV LAB;  Service: Cardiovascular;  Laterality: N/A;  . STENT PLACEMENT VASCULAR ([facility] HX)  12/1998  . TEE WITHOUT CARDIOVERSION N/A 08/09/2020   Procedure: TRANSESOPHAGEAL ECHOCARDIOGRAM (TEE);  Surgeon: Donata Clay, Theron Arista, MD;  Location: [facility] OR;  Service: Open Heart Surgery;  Laterality: N/A;  . TONSILLECTOMY      Family History  Problem Relation Age of Onset  . Heart disease Mother   . Heart disease Father     Social History Social History   Tobacco Use  . Smoking status: Former Smoker    Packs/day: 0.75    Types: Cigarettes    Quit date: 08/05/2020    Years since quitting: 0.2  . Smokeless tobacco: Never Used  Vaping Use  . Vaping Use: Never used  Substance Use Topics  . Alcohol use: Never  . Drug use: Never    Current Outpatient Medications  Medication Sig Dispense Refill  . acetaminophen (TYLENOL) 500 MG tablet Take 1,000 mg by mouth every 6 (six) hours as needed for mild pain.    Marland Kitchen aspirin EC 81 MG tablet Take 1 tablet (81 mg total) by mouth daily. Swallow whole. 90 tablet 3  . carvedilol (COREG) 6.25 MG tablet Take 1 tablet (6.25 mg total) by mouth 2 (two) times daily with a meal. 60 tablet 2  . Coenzyme Q10 (COQ-10 PO) Take 1 capsule by mouth daily.    Marland Kitchen GARLIC PO  Take 1 tablet by mouth daily.    Marland Kitchen guaiFENesin (MUCINEX) 600 MG 12 hr tablet Take 1 tablet (600 mg total) by mouth 2 (two) times daily as needed.    . Multiple Vitamins-Minerals (MULTIVITAMIN WITH MINERALS) tablet Take 1 tablet by mouth daily.    . Omega-3 Fatty Acids (FISH OIL) 1000 MG CAPS Take 1,000 mg by mouth in the morning and at bedtime.    . potassium chloride SA (KLOR-CON) 20 MEQ tablet Take 1 tablet (20 mEq total) by mouth daily. 7 tablet 0  . rosuvastatin (CRESTOR) 40 MG tablet Take 40 mg by mouth daily.    Marland Kitchen SOYA LECITHIN PO Take 1 capsule by mouth in the morning and at bedtime.    . vitamin E (VITAMIN E) 180 MG (400 UNITS) capsule Take 400 Units by mouth  daily.    . clopidogrel (PLAVIX) 75 MG tablet Take 1 tablet (75 mg total) by mouth daily. (Patient not taking: Reported on 10/27/2020) 90 tablet 1   No current facility-administered medications for this visit.    Allergies  Allergen Reactions  . Entresto [Sacubitril-Valsartan] Other (See Comments)    Severe weakness, fatigue, ? Contributed to syncopal episode    Review of Systems  Feels well but he states the Plavix makes him feel terrible. No clicks or pop sensations in the sternal incision. BP 111/75 (BP Location: Left Arm, Patient Position: Sitting)   Pulse 97   Resp 18   Ht 5\' 9"  (1.753 m)   Wt 190 lb (86.2 kg)   SpO2 97% Comment: RA with mask on  BMI 28.06 kg/m  Physical Exam      Exam    General- alert and comfortable.  Surgical incisions well-healed.    Neck- no JVD, no cervical adenopathy palpable, no carotid bruit   Lungs- clear without rales, wheezes   Cor- regular rate and rhythm, no murmur , gallop   Abdomen- soft, non-tender   Extremities - warm, non-tender, minimal edema   Neuro- oriented, appropriate, no focal weakness   Diagnostic Tests: None  Impression: Continue normal activities after December 1.  Continue with medications as noted above.  He understands importance of heart healthy lifestyle to include heart healthy diet and 30 minutes of walking 5 days a week.  Plan: Patient will be followed up by his cardiologist and return here as needed.   01-22-1970, MD [facility] [phone]

## 2020-11-24 DIAGNOSIS — U071 COVID-19: Secondary | ICD-10-CM

## 2020-11-24 HISTORY — DX: COVID-19: U07.1

## 2020-11-29 ENCOUNTER — Other Ambulatory Visit: Payer: Commercial Managed Care - PPO

## 2021-01-05 DIAGNOSIS — I25810 Atherosclerosis of coronary artery bypass graft(s) without angina pectoris: Secondary | ICD-10-CM | POA: Insufficient documentation

## 2021-01-05 DIAGNOSIS — I2510 Atherosclerotic heart disease of native coronary artery without angina pectoris: Secondary | ICD-10-CM | POA: Insufficient documentation

## 2021-01-07 ENCOUNTER — Ambulatory Visit: Payer: Commercial Managed Care - PPO | Admitting: Cardiology

## 2021-01-13 ENCOUNTER — Other Ambulatory Visit: Payer: Self-pay | Admitting: Cardiology

## 2021-01-14 NOTE — Telephone Encounter (Signed)
Refill sent to pharmacy.   

## 2021-02-16 ENCOUNTER — Ambulatory Visit (INDEPENDENT_AMBULATORY_CARE_PROVIDER_SITE_OTHER): Payer: Commercial Managed Care - PPO

## 2021-02-16 ENCOUNTER — Other Ambulatory Visit: Payer: Self-pay

## 2021-02-16 DIAGNOSIS — F17200 Nicotine dependence, unspecified, uncomplicated: Secondary | ICD-10-CM

## 2021-02-16 DIAGNOSIS — I255 Ischemic cardiomyopathy: Secondary | ICD-10-CM | POA: Diagnosis not present

## 2021-02-16 DIAGNOSIS — I10 Essential (primary) hypertension: Secondary | ICD-10-CM | POA: Diagnosis not present

## 2021-02-16 DIAGNOSIS — Z951 Presence of aortocoronary bypass graft: Secondary | ICD-10-CM

## 2021-02-16 LAB — ECHOCARDIOGRAM COMPLETE
Area-P 1/2: 3.43 cm2
Calc EF: 35.1 %
S' Lateral: 5.2 cm
Single Plane A2C EF: 37.9 %
Single Plane A4C EF: 30.1 %

## 2021-02-16 MED ORDER — PERFLUTREN LIPID MICROSPHERE
1.0000 mL | INTRAVENOUS | Status: AC | PRN
  Administered 2021-02-16: 7 mL via INTRAVENOUS

## 2021-02-16 NOTE — Progress Notes (Signed)
Complete echocardiogram with contrast performed.  Jimmy Reel RDCS, RVT  

## 2021-02-17 ENCOUNTER — Telehealth: Payer: Self-pay

## 2021-02-17 NOTE — Telephone Encounter (Signed)
Patient notified of results. He already had an appt on 03/28 but I messaged Dr. Bing Matter if needed to be seen sooner.

## 2021-02-17 NOTE — Telephone Encounter (Signed)
-----   Message from Georgeanna Lea, MD sent at 02/16/2021  8:43 PM EST ----- Left ventricle ejection fraction series still severely reduced.  He need to have a follow-up appointment with me to talk about ICD

## 2021-03-09 ENCOUNTER — Other Ambulatory Visit: Payer: Self-pay

## 2021-03-09 ENCOUNTER — Encounter: Payer: Self-pay | Admitting: Cardiology

## 2021-03-09 ENCOUNTER — Ambulatory Visit: Payer: Commercial Managed Care - PPO | Admitting: Cardiology

## 2021-03-09 VITALS — BP 142/90 | HR 81 | Ht 69.0 in | Wt 197.0 lb

## 2021-03-09 DIAGNOSIS — R5382 Chronic fatigue, unspecified: Secondary | ICD-10-CM

## 2021-03-09 DIAGNOSIS — Z0000 Encounter for general adult medical examination without abnormal findings: Secondary | ICD-10-CM

## 2021-03-09 DIAGNOSIS — I5032 Chronic diastolic (congestive) heart failure: Secondary | ICD-10-CM

## 2021-03-09 DIAGNOSIS — I10 Essential (primary) hypertension: Secondary | ICD-10-CM

## 2021-03-09 DIAGNOSIS — E785 Hyperlipidemia, unspecified: Secondary | ICD-10-CM

## 2021-03-09 DIAGNOSIS — F17200 Nicotine dependence, unspecified, uncomplicated: Secondary | ICD-10-CM

## 2021-03-09 DIAGNOSIS — Z951 Presence of aortocoronary bypass graft: Secondary | ICD-10-CM | POA: Diagnosis not present

## 2021-03-09 DIAGNOSIS — M545 Low back pain, unspecified: Secondary | ICD-10-CM | POA: Insufficient documentation

## 2021-03-09 DIAGNOSIS — R011 Cardiac murmur, unspecified: Secondary | ICD-10-CM | POA: Insufficient documentation

## 2021-03-09 DIAGNOSIS — I255 Ischemic cardiomyopathy: Secondary | ICD-10-CM | POA: Diagnosis not present

## 2021-03-09 DIAGNOSIS — E291 Testicular hypofunction: Secondary | ICD-10-CM | POA: Insufficient documentation

## 2021-03-09 DIAGNOSIS — G9332 Myalgic encephalomyelitis/chronic fatigue syndrome: Secondary | ICD-10-CM

## 2021-03-09 DIAGNOSIS — M25519 Pain in unspecified shoulder: Secondary | ICD-10-CM

## 2021-03-09 DIAGNOSIS — N50819 Testicular pain, unspecified: Secondary | ICD-10-CM

## 2021-03-09 DIAGNOSIS — N390 Urinary tract infection, site not specified: Secondary | ICD-10-CM

## 2021-03-09 HISTORY — DX: Low back pain, unspecified: M54.50

## 2021-03-09 HISTORY — DX: Testicular pain, unspecified: N50.819

## 2021-03-09 HISTORY — DX: Urinary tract infection, site not specified: N39.0

## 2021-03-09 HISTORY — DX: Testicular hypofunction: E29.1

## 2021-03-09 HISTORY — DX: Pain in unspecified shoulder: M25.519

## 2021-03-09 HISTORY — DX: Chronic diastolic (congestive) heart failure: I50.32

## 2021-03-09 HISTORY — DX: Cardiac murmur, unspecified: R01.1

## 2021-03-09 HISTORY — DX: Encounter for general adult medical examination without abnormal findings: Z00.00

## 2021-03-09 HISTORY — DX: Chronic fatigue, unspecified: R53.82

## 2021-03-09 HISTORY — DX: Myalgic encephalomyelitis/chronic fatigue syndrome: G93.32

## 2021-03-09 HISTORY — DX: Hyperlipidemia, unspecified: E78.5

## 2021-03-09 MED ORDER — LOSARTAN POTASSIUM 25 MG PO TABS: 25.0000 mg | ORAL_TABLET | Freq: Every day | ORAL | 3 refills | Status: DC

## 2021-03-09 NOTE — Patient Instructions (Addendum)
Medication Instructions:  Your physician has recommended you make the following change in your medication:   Start: Losartan 25 mg daily  *If you need a refill on your cardiac medications before your next appointment, please call your pharmacy*   Lab Work: Your physician recommends that you return for lab work today: bmp   Your physician recommends that you return for lab work in 1 week: bmp   If you have labs (blood work) drawn today and your tests are completely normal, you will receive your results only by: Marland Kitchen MyChart Message (if you have MyChart) OR . A paper copy in the mail If you have any lab test that is abnormal or we need to change your treatment, we will call you to review the results.   Testing/Procedures: None   Follow-Up: At [facility], you and your health needs are our priority.  As part of our continuing mission to provide you with exceptional heart care, we have created designated Provider Care Teams.  These Care Teams include your primary Cardiologist (physician) and Advanced Practice Providers (APPs -  Physician Assistants and Nurse Practitioners) who all work together to provide you with the care you need, when you need it.  We recommend signing up for the patient portal called "MyChart".  Sign up information is provided on this After Visit Summary.  MyChart is used to connect with patients for Virtual Visits (Telemedicine).  Patients are able to view lab/test results, encounter notes, upcoming appointments, etc.  Non-urgent messages can be sent to your provider as well.   To learn more about what you can do with MyChart, go to [email or web address].    Your next appointment:   1 month(s)  The format for your next appointment:   In Person  Provider:   Gypsy Balsam, MD   Other Instructions  Losartan Tablets What is this medicine? LOSARTAN (loe SAR tan) is an angiotensin II receptor blocker, also known as an ARB. It treats high blood pressure. It  can slow kidney damage in some patients. It may also be used to lower the risk of stroke. This medicine may be used for other purposes; ask your health care provider or pharmacist if you have questions. COMMON BRAND NAME(S): Cozaar What should I tell my health care provider before I take this medicine? They need to know if you have any of these conditions:  heart failure  kidney disease  liver disease  an unusual or allergic reaction to losartan, other medicines, foods, dyes, or preservatives  pregnant or trying to get pregnant  breast-feeding How should I use this medicine? Take this medicine by mouth. Take it as directed on the prescription label at the same time every day. You can take it with or without food. If it upsets your stomach, take it with food. Keep taking it unless your health care provider tells you to stop. Talk to your health care provider about the use of this medicine in children. While it may be prescribed for children as young as 6 for selected conditions, precautions do apply. Overdosage: If you think you have taken too much of this medicine contact a poison control center or emergency room at once. NOTE: This medicine is only for you. Do not share this medicine with others. What if I miss a dose? If you miss a dose, take it as soon as you can. If it is almost time for your next dose, take only that dose. Do not take double or extra doses. What may  interact with this medicine?  aliskiren  ACE inhibitors, like enalapril or lisinopril  diuretics, especially amiloride, eplerenone, spironolactone, or triamterene  lithium  NSAIDs, medicines for pain and inflammation, like ibuprofen or naproxen  potassium salts or potassium supplements This list may not describe all possible interactions. Give your health care provider a list of all the medicines, herbs, non-prescription drugs, or dietary supplements you use. Also tell them if you smoke, drink alcohol, or use  illegal drugs. Some items may interact with your medicine. What should I watch for while using this medicine? Visit your health care provider for regular check ups. Check your blood pressure as directed. Ask your health care provider what your blood pressure should be. Also, find out when you should contact him or her. Do not treat yourself for coughs, colds, or pain while you are using this medicine without asking your health care provider for advice. Some medicines may increase your blood pressure. Women should inform their health care provider if they wish to become pregnant or think they might be pregnant. There is a potential for serious side effects to an unborn child. Talk to your health care provider for more information. You may get drowsy or dizzy. Do not drive, use machinery, or do anything that needs mental alertness until you know how this medicine affects you. Do not stand or sit up quickly, especially if you are an older patient. This reduces the risk of dizzy or fainting spells. Alcohol can make you more drowsy and dizzy. Avoid alcoholic drinks. Avoid salt substitutes unless you are told otherwise by your health care provider. What side effects may I notice from receiving this medicine? Side effects that you should report to your doctor or health care professional as soon as possible:  allergic reactions (skin rash, itching or hives, swelling of the hands, feet, face, lips, throat, or tongue)  breathing problems  high potassium levels (chest pain; or fast, irregular heartbeat; muscle weakness)  kidney injury (trouble passing urine or change in the amount of urine)  low blood pressure (dizziness; feeling faint or lightheaded, falls; unusually weak or tired) Side effects that usually do not require medical attention (report to your doctor or health care professional if they continue or are bothersome):  cough  headache  nasal congestion or stuffiness  nausea or stomach  pain This list may not describe all possible side effects. Call your doctor for medical advice about side effects. You may report side effects to FDA at 1-800-FDA-1088. Where should I keep my medicine? Keep out of the reach of children and pets. Store at room temperature between 20 and 25 degrees C (68 and 77 degrees F). Protect from light. Keep the container tightly closed. Get rid of any unused medicine after the expiration date. To get rid of medicines that are no longer needed or have expired:  Take the medicine to a medicine take-back program. Check with your pharmacy or law enforcement to find a location.  If you cannot return the medicine, check the label or package insert to see if the medicine should be thrown out in the garbage or flushed down the toilet. If you are not sure, ask your health care provider. If it is safe to put in the trash, empty the medicine out of the container. Mix the medicine with cat litter, dirt, coffee grounds, or other unwanted substance. Seal the mixture in a bag or container. Put it in the trash. NOTE: This sheet is a summary. It may not cover  all possible information. If you have questions about this medicine, talk to your doctor, pharmacist, or health care provider.  2021 Elsevier/Gold Standard (2020-02-05 16:16:09)

## 2021-03-09 NOTE — Progress Notes (Signed)
Cardiology Office Note:    Date:  03/09/2021   ID:  Brion Aliment IV Velasques, DOB 1966-11-06, MRN [id number]  PCP:  Galvin Proffer, MD  Cardiologist:  Gypsy Balsam, MD    Referring MD: Galvin Proffer, MD   Chief Complaint  Patient presents with  . Follow-up  I am doing better, getting stronger  History of Present Illness:    Velmer Woelfel IV Kleppe is a 55 y.o. male   with past medical history significant for coronary artery disease, 21 years ago I treated him for acute anterior wall myocardial infarction, recently presented to my office because of episode of syncope he was found to have significantly diminished left ventricle ejection fraction, troponin I done at that time was significantly elevated, he was brought to [facility] cardiac catheterization was done which showed significant triple-vessel disease, he was also found to have significantly diminished left ventricle ejection fraction 25% range, he was offered coronary artery bypass graft which was done just 2 weeks ago. LIMA to LAD SVG to ramus intermedius, SVG to obtuse marginal, SVG to PDA. He is recovering after surgery quite well.  Comes today 2 months of follow-up overall he said he is getting a bit better and stronger.  He is able to walk and do some exercises event.  Denies have any chest pain tightness squeezing pressure burning chest get tired more easily but no shortness of breath no swelling of lower extremities no syncope no dizziness.  Past Medical History:  Diagnosis Date  . Atherosclerosis of coronary artery bypass graft(s) without angina pectoris   . Cardiac murmur, unspecified 03/09/2021  . Chronic diastolic heart failure (HCC) 03/09/2021  . Chronic fatigue syndrome 03/09/2021  . Coronary artery disease   . Coronary atherosclerosis 06/10/2020  . Encounter for general adult medical examination without abnormal findings 03/09/2021  . Essential hypertension 06/10/2020  . GERD (gastroesophageal reflux disease)   . Heart  attack (HCC) 12/1998  . Ischemic cardiomyopathy ejection fraction 30 to 35% as per patient 06/10/2020  . Low back pain 03/09/2021  . NSTEMI (non-ST elevated myocardial infarction) (HCC) 08/05/2020  . Pain of testes 03/09/2021  . Postop check 09/08/2020  . S/P CABG x 4LIMA - LAD, SVG-RI, SVG-OM, SVG-PDA 2021 08/09/2020  . Shoulder joint pain 03/09/2021  . Smoking 06/10/2020  . Syncope   . Testicular hypofunction 03/09/2021  . Urinary tract infectious disease 03/09/2021    Past Surgical History:  Procedure Laterality Date  . CORONARY ANGIOPLASTY     2000 stent   . CORONARY ARTERY BYPASS GRAFT N/A 08/09/2020   Procedure: CORONARY ARTERY BYPASS GRAFTING (CABG) x 4, LIMA TO LAD, SVG TO OM1, SVG TO RAMUS, SVG TO PDA;  Surgeon: Donata Clay, Theron Arista, MD;  Location: [facility] OR;  Service: Open Heart Surgery;  Laterality: N/A;  . ENDOVEIN HARVEST OF GREATER SAPHENOUS VEIN Right 08/09/2020   Procedure: ENDOVEIN HARVEST OF GREATER SAPHENOUS VEIN;  Surgeon: Kerin Perna, MD;  Location: [facility] OR;  Service: Open Heart Surgery;  Laterality: Right;  . LEFT HEART CATH AND CORONARY ANGIOGRAPHY N/A 08/06/2020   Procedure: LEFT HEART CATH AND CORONARY ANGIOGRAPHY;  Surgeon: Swaziland, Peter M, MD;  Location: [facility] INVASIVE CV LAB;  Service: Cardiovascular;  Laterality: N/A;  . STENT PLACEMENT VASCULAR ([facility] HX)  12/1998  . TEE WITHOUT CARDIOVERSION N/A 08/09/2020   Procedure: TRANSESOPHAGEAL ECHOCARDIOGRAM (TEE);  Surgeon: Donata Clay, Theron Arista, MD;  Location: [facility] OR;  Service: Open Heart Surgery;  Laterality: N/A;  . TONSILLECTOMY  Current Medications: Current Meds  Medication Sig  . acetaminophen (TYLENOL) 500 MG tablet Take 1,000 mg by mouth every 6 (six) hours as needed for mild pain.  Marland Kitchen aspirin EC 81 MG tablet Take 1 tablet (81 mg total) by mouth daily. Swallow whole.  . carvedilol (COREG) 6.25 MG tablet TAKE 1 TABLET BY MOUTH TWICE DAILY WITH MEALS (Patient taking differently: Take 6.25 mg by mouth 2 (two) times daily with a  meal.)  . clopidogrel (PLAVIX) 75 MG tablet Take 1 tablet (75 mg total) by mouth daily.  . Coenzyme Q10 (COQ-10 PO) Take 1 capsule by mouth daily.  Marland Kitchen GARLIC PO Take 1 tablet by mouth daily. Unknown strength  . guaiFENesin (MUCINEX) 600 MG 12 hr tablet Take 1 tablet (600 mg total) by mouth 2 (two) times daily as needed.  . Multiple Vitamins-Minerals (MULTIVITAMIN WITH MINERALS) tablet Take 1 tablet by mouth daily. Unknown strenght  . Omega-3 Fatty Acids (FISH OIL) 1000 MG CAPS Take 1,000 mg by mouth in the morning and at bedtime.  . potassium chloride SA (KLOR-CON) 20 MEQ tablet Take 1 tablet (20 mEq total) by mouth daily.  . rosuvastatin (CRESTOR) 40 MG tablet Take 40 mg by mouth daily.  Marland Kitchen SOYA LECITHIN PO Take 1 capsule by mouth in the morning and at bedtime.  . vitamin E 180 MG (400 UNITS) capsule Take 400 Units by mouth daily.     Allergies:   Entresto [sacubitril-valsartan]   Social History   Socioeconomic History  . Marital status: Single    Spouse name: Not on file  . Number of children: Not on file  . Years of education: Not on file  . Highest education level: Not on file  Occupational History  . Not on file  Tobacco Use  . Smoking status: Former Smoker    Packs/day: 0.75    Types: Cigarettes    Quit date: 08/05/2020    Years since quitting: 0.5  . Smokeless tobacco: Never Used  Vaping Use  . Vaping Use: Never used  Substance and Sexual Activity  . Alcohol use: Never  . Drug use: Never  . Sexual activity: Not Currently  Other Topics Concern  . Not on file  Social History Narrative  . Not on file   Social Determinants of Health   Financial Resource Strain: Not on file  Food Insecurity: Not on file  Transportation Needs: Not on file  Physical Activity: Not on file  Stress: Not on file  Social Connections: Not on file     Family History: The patient's family history includes Heart disease in his father and mother. ROS:   Please see the history of present  illness.    All 14 point review of systems negative except as described per history of present illness  EKGs/Labs/Other Studies Reviewed:      Recent Labs: 08/05/2020: B Natriuretic Peptide 422.9; TSH 1.874 08/06/2020: ALT 27 08/10/2020: Magnesium 2.3 09/01/2020: Hemoglobin 14.1; Platelets 416 10/07/2020: BUN 11; Creatinine, Ser 0.90; Potassium 4.5; Sodium 140  Recent Lipid Panel    Component Value Date/Time   CHOL 166 08/06/2020 0639   CHOL 194 06/10/2020 0928   TRIG 87 08/06/2020 0639   HDL 34 (L) 08/06/2020 0639   HDL 32 (L) 06/10/2020 0928   CHOLHDL 4.9 08/06/2020 0639   VLDL 17 08/06/2020 0639   LDLCALC 115 (H) 08/06/2020 0639   LDLCALC 123 (H) 06/10/2020 0928    Physical Exam:    VS:  BP (!) 142/90 (BP Location:  Left Arm, Patient Position: Sitting)   Pulse 81   Ht 5\' 9"  (1.753 m)   Wt 197 lb (89.4 kg)   SpO2 98%   BMI 29.09 kg/m     Wt Readings from Last 3 Encounters:  03/09/21 197 lb (89.4 kg)  10/27/20 190 lb (86.2 kg)  10/07/20 185 lb 9.6 oz (84.2 kg)     GEN:  Well nourished, well developed in no acute distress HEENT: Normal NECK: No JVD; No carotid bruits LYMPHATICS: No lymphadenopathy CARDIAC: RRR, no murmurs, no rubs, no gallops RESPIRATORY:  Clear to auscultation without rales, wheezing or rhonchi  ABDOMEN: Soft, non-tender, non-distended MUSCULOSKELETAL:  No edema; No deformity  SKIN: Warm and dry LOWER EXTREMITIES: no swelling NEUROLOGIC:  Alert and oriented x 3 PSYCHIATRIC:  Normal affect   ASSESSMENT:    1. S/P CABG x 4LIMA - LAD, SVG-RI, SVG-OM, SVG-PDA 2021   2. Ischemic cardiomyopathy ejection fraction 30 to 35% as per patient   3. Essential hypertension   4. Smoking   5. Dyslipidemia    PLAN:    In order of problems listed above:  1. Coronary disease status post coronary bypass grafting 2021.  Recovered completely.  On appropriate medications which I will continue.  Asymptomatic. 2. Cardiomyopathy with ejection fraction 20 to 25%.   I have difficulty putting him on appropriate medication.  Previously a trial on Entresto however he became dizzy and almost passed out.  And he frankly refused to take this medication anymore.  He is on beta-blocker rate in form of carvedilol 6.25.  Previously had difficulty putting him on additional medication because of low blood pressure however today blood pressure is elevated there is plenty of room to put at least ARB.  Therefore, we will do Chem-7 today I will put him on 50 mg of losartan, he will have Chem-7 done within the next week. 3. Dyslipidemia: He is on high intensity statin 40 mg Crestor which I will continue.  I will schedule him to have cholesterol today. 4. Need for ICD with severely reduced left ventricle ejection fraction.  He will be referred to our EP team.  Sadly I still do not have you on optimal medical therapy hopefully quickly will be able to ramp it up.  Last echocardiogram reviewed with him ejection fraction 20 to 25%.   Medication Adjustments/Labs and Tests Ordered: Current medicines are reviewed at length with the patient today.  Concerns regarding medicines are outlined above.  No orders of the defined types were placed in this encounter.  Medication changes: No orders of the defined types were placed in this encounter.   Signed, 2022, MD, [facility] 03/09/2021 9:47 AM    [facility]

## 2021-03-09 NOTE — Addendum Note (Signed)
Addended by: Hazle Quant on: 03/09/2021 10:07 AM   Modules accepted: Orders

## 2021-03-09 NOTE — Addendum Note (Signed)
Addended by: Hazle Quant on: 03/09/2021 10:11 AM   Modules accepted: Orders

## 2021-03-10 LAB — BASIC METABOLIC PANEL
BUN/Creatinine Ratio: 10 (ref 9–20)
BUN: 9 mg/dL (ref 6–24)
CO2: 24 mmol/L (ref 20–29)
Calcium: 9.8 mg/dL (ref 8.7–10.2)
Chloride: 102 mmol/L (ref 96–106)
Creatinine, Ser: 0.91 mg/dL (ref 0.76–1.27)
Glucose: 94 mg/dL (ref 65–99)
Potassium: 4.4 mmol/L (ref 3.5–5.2)
Sodium: 141 mmol/L (ref 134–144)
eGFR: 100 mL/min/{1.73_m2} (ref >59)

## 2021-03-15 ENCOUNTER — Other Ambulatory Visit: Payer: Self-pay | Admitting: Cardiology

## 2021-03-17 LAB — BASIC METABOLIC PANEL
BUN/Creatinine Ratio: 13 (ref 9–20)
BUN: 12 mg/dL (ref 6–24)
CO2: 22 mmol/L (ref 20–29)
Calcium: 9.9 mg/dL (ref 8.7–10.2)
Chloride: 101 mmol/L (ref 96–106)
Creatinine, Ser: 0.93 mg/dL (ref 0.76–1.27)
Glucose: 97 mg/dL (ref 65–99)
Potassium: 4.2 mmol/L (ref 3.5–5.2)
Sodium: 140 mmol/L (ref 134–144)
eGFR: 98 mL/min/{1.73_m2} (ref >59)

## 2021-03-18 ENCOUNTER — Telehealth: Payer: Self-pay

## 2021-03-18 NOTE — Telephone Encounter (Signed)
-----   Message from Georgeanna Lea, MD sent at 03/17/2021  9:58 PM EDT ----- Chem-7 was good, continue present management

## 2021-03-18 NOTE — Telephone Encounter (Signed)
Patient notified of test results 

## 2021-04-11 ENCOUNTER — Other Ambulatory Visit: Payer: Self-pay | Admitting: Cardiology

## 2021-04-11 ENCOUNTER — Institutional Professional Consult (permissible substitution): Payer: Commercial Managed Care - PPO | Admitting: Cardiology

## 2021-04-12 NOTE — Telephone Encounter (Signed)
Carvedilol approved and sent 

## 2021-04-18 DIAGNOSIS — I2510 Atherosclerotic heart disease of native coronary artery without angina pectoris: Secondary | ICD-10-CM | POA: Insufficient documentation

## 2021-04-21 ENCOUNTER — Ambulatory Visit: Payer: Commercial Managed Care - PPO | Admitting: Cardiology

## 2021-04-21 ENCOUNTER — Encounter: Payer: Self-pay | Admitting: Cardiology

## 2021-04-21 ENCOUNTER — Other Ambulatory Visit: Payer: Self-pay

## 2021-04-21 VITALS — BP 116/74 | HR 68 | Ht 69.0 in | Wt 199.0 lb

## 2021-04-21 DIAGNOSIS — U071 COVID-19: Secondary | ICD-10-CM

## 2021-04-21 DIAGNOSIS — F17200 Nicotine dependence, unspecified, uncomplicated: Secondary | ICD-10-CM

## 2021-04-21 DIAGNOSIS — Z951 Presence of aortocoronary bypass graft: Secondary | ICD-10-CM | POA: Diagnosis not present

## 2021-04-21 DIAGNOSIS — I2510 Atherosclerotic heart disease of native coronary artery without angina pectoris: Secondary | ICD-10-CM | POA: Diagnosis not present

## 2021-04-21 DIAGNOSIS — E785 Hyperlipidemia, unspecified: Secondary | ICD-10-CM

## 2021-04-21 DIAGNOSIS — I255 Ischemic cardiomyopathy: Secondary | ICD-10-CM

## 2021-04-21 MED ORDER — LOSARTAN POTASSIUM 25 MG PO TABS: 25.0000 mg | ORAL_TABLET | Freq: Two times a day (BID) | ORAL | 1 refills | Status: DC

## 2021-04-21 NOTE — Addendum Note (Signed)
Addended by: Hazle Quant on: 04/21/2021 09:26 AM   Modules accepted: Orders

## 2021-04-21 NOTE — Progress Notes (Signed)
Cardiology Office Note:    Date:  04/21/2021   ID:  Calvin Rodriguez, DOB [birth year], MRN [id number]  PCP:  Galvin Proffer, MD  Cardiologist:  Gypsy Balsam, MD    Referring MD: Galvin Proffer, MD   Chief Complaint  Patient presents with  . Shortness of Breath    Post Covid 11/24/20    History of Present Illness:    Calvin Rodriguez is a 55 y.o. male with past medical history significant for coronary artery disease, 21 years ago I treated him for acute anterior wall myocardial infarction, recently presented to my office because of episode of syncope he was found to have significantly diminished left ventricle ejection fraction, troponin I done at that time was significantly elevated, he was brought to [facility] cardiac catheterization was done which showed significant triple-vessel disease, he was also found to have significantly diminished left ventricle ejection fraction 25% range, he was offered coronary artery bypass graft which was done just 2 weeks ago. LIMA to LAD SVG to ramus intermedius, SVG to obtuse marginal, SVG to PDA. He is recovering after surgery quite well. [name] comes today 2 months of follow-up.  Overall he said he is doing well he does not have any dizziness no chest pain tightness squeezing pressure burning chest.  He does have some exertional shortness of breath.  He was scheduled to see EP team about a month ago however he got sick with stomach virus he did not go.  He is scheduled to have it done within the next month or so.  Luckily denies have any dizziness or passing out.  Past Medical History:  Diagnosis Date  . Atherosclerosis of coronary artery bypass graft(s) without angina pectoris   . Cardiac murmur, unspecified 03/09/2021  . Chronic diastolic heart failure (HCC) 03/09/2021  . Chronic fatigue syndrome 03/09/2021  . Coronary artery disease   . Coronary atherosclerosis 06/10/2020  . COVID 11/24/2020  . Dyslipidemia 03/09/2021  . Encounter for  general adult medical examination without abnormal findings 03/09/2021  . Essential hypertension 06/10/2020  . GERD (gastroesophageal reflux disease)   . Heart attack (HCC) 12/1998  . Ischemic cardiomyopathy ejection fraction 30 to 35% as per patient 06/10/2020  . Low back pain 03/09/2021  . NSTEMI (non-ST elevated myocardial infarction) (HCC) 08/05/2020  . Pain of testes 03/09/2021  . Postop check 09/08/2020  . S/P CABG x 4LIMA - LAD, SVG-RI, SVG-OM, SVG-PDA 2021 08/09/2020  . Shoulder joint pain 03/09/2021  . Smoking 06/10/2020  . Syncope   . Testicular hypofunction 03/09/2021  . Urinary tract infectious disease 03/09/2021    Past Surgical History:  Procedure Laterality Date  . CORONARY ANGIOPLASTY     2000 stent   . CORONARY ARTERY BYPASS GRAFT N/A 08/09/2020   Procedure: CORONARY ARTERY BYPASS GRAFTING (CABG) x 4, LIMA TO LAD, SVG TO OM1, SVG TO RAMUS, SVG TO PDA;  Surgeon: Donata Clay, Theron Arista, MD;  Location: [facility] OR;  Service: Open Heart Surgery;  Laterality: N/A;  . ENDOVEIN HARVEST OF GREATER SAPHENOUS VEIN Right 08/09/2020   Procedure: ENDOVEIN HARVEST OF GREATER SAPHENOUS VEIN;  Surgeon: Kerin Perna, MD;  Location: [facility] OR;  Service: Open Heart Surgery;  Laterality: Right;  . LEFT HEART CATH AND CORONARY ANGIOGRAPHY N/A 08/06/2020   Procedure: LEFT HEART CATH AND CORONARY ANGIOGRAPHY;  Surgeon: Swaziland, Peter M, MD;  Location: [facility] INVASIVE CV LAB;  Service: Cardiovascular;  Laterality: N/A;  . STENT PLACEMENT VASCULAR ([facility] HX)  12/1998  .  TEE WITHOUT CARDIOVERSION N/A 08/09/2020   Procedure: TRANSESOPHAGEAL ECHOCARDIOGRAM (TEE);  Surgeon: Donata Clay, Theron Arista, MD;  Location: [facility] OR;  Service: Open Heart Surgery;  Laterality: N/A;  . TONSILLECTOMY      Current Medications: Current Meds  Medication Sig  . acetaminophen (TYLENOL) 500 MG tablet Take 1,000 mg by mouth every 6 (six) hours as needed for mild pain.  Marland Kitchen aspirin EC 81 MG tablet Take 1 tablet (81 mg total) by mouth daily. Swallow whole.  . B  Complex Vitamins (B COMPLEX PO) Take 1 tablet by mouth daily. Unknown strength  . carvedilol (COREG) 6.25 MG tablet TAKE 1 TABLET BY MOUTH TWICE DAILY WITH MEALS (Patient taking differently: Take 6.25 mg by mouth 2 (two) times daily with a meal.)  . Coenzyme Q10 (COQ-10 PO) Take 1 capsule by mouth 2 (two) times daily.  Marland Kitchen losartan (COZAAR) 25 MG tablet Take 1 tablet (25 mg total) by mouth daily.  . Multiple Vitamins-Minerals (MULTIVITAMIN WITH MINERALS) tablet Take 1 tablet by mouth daily. Unknown strenght  . Omega-3 Fatty Acids (FISH OIL) 1000 MG CAPS Take 1,000 mg by mouth in the morning and at bedtime.  . rosuvastatin (CRESTOR) 40 MG tablet Take 1 tablet by mouth once daily (Patient taking differently: Take 40 mg by mouth daily.)  . SOYA LECITHIN PO Take 1 capsule by mouth in the morning and at bedtime. Unknown strength  . vitamin E 180 MG (400 UNITS) capsule Take 400 Units by mouth daily.  . [DISCONTINUED] clopidogrel (PLAVIX) 75 MG tablet Take 1 tablet (75 mg total) by mouth daily.     Allergies:   Entresto [sacubitril-valsartan]   Social History   Socioeconomic History  . Marital status: Single    Spouse name: Not on file  . Number of children: Not on file  . Years of education: Not on file  . Highest education level: Not on file  Occupational History  . Not on file  Tobacco Use  . Smoking status: Former Smoker    Packs/day: 0.75    Types: Cigarettes    Quit date: 08/05/2020    Years since quitting: 0.7  . Smokeless tobacco: Never Used  Vaping Use  . Vaping Use: Never used  Substance and Sexual Activity  . Alcohol use: Never  . Drug use: Never  . Sexual activity: Not Currently  Other Topics Concern  . Not on file  Social History Narrative  . Not on file   Social Determinants of Health   Financial Resource Strain: Not on file  Food Insecurity: Not on file  Transportation Needs: Not on file  Physical Activity: Not on file  Stress: Not on file  Social Connections:  Not on file     Family History: The patient's family history includes Heart disease in his father and mother. ROS:   Please see the history of present illness.    All 14 point review of systems negative except as described per history of present illness  EKGs/Labs/Other Studies Reviewed:      Recent Labs: 08/05/2020: B Natriuretic Peptide 422.9; TSH 1.874 08/06/2020: ALT 27 08/10/2020: Magnesium 2.3 09/01/2020: Hemoglobin 14.1; Platelets 416 03/16/2021: BUN 12; Creatinine, Ser 0.93; Potassium 4.2; Sodium 140  Recent Lipid Panel    Component Value Date/Time   CHOL 166 08/06/2020 0639   CHOL 194 06/10/2020 0928   TRIG 87 08/06/2020 0639   HDL 34 (L) 08/06/2020 0639   HDL 32 (L) 06/10/2020 0928   CHOLHDL 4.9 08/06/2020 0639   VLDL  17 08/06/2020 0639   LDLCALC 115 (H) 08/06/2020 0639   LDLCALC 123 (H) 06/10/2020 7253    Physical Exam:    VS:  BP 116/74 (BP Location: Left Arm, Patient Position: Sitting)   Pulse 68   Ht 5\' 9"  (1.753 m)   Wt 199 lb (90.3 kg)   SpO2 96%   BMI 29.39 kg/m     Wt Readings from Last 3 Encounters:  04/21/21 199 lb (90.3 kg)  03/09/21 197 lb (89.4 kg)  10/27/20 190 lb (86.2 kg)     GEN:  Well nourished, well developed in no acute distress HEENT: Normal NECK: No JVD; No carotid bruits LYMPHATICS: No lymphadenopathy CARDIAC: RRR, no murmurs, no rubs, no gallops RESPIRATORY:  Clear to auscultation without rales, wheezing or rhonchi  ABDOMEN: Soft, non-tender, non-distended MUSCULOSKELETAL:  No edema; No deformity  SKIN: Warm and dry LOWER EXTREMITIES: no swelling NEUROLOGIC:  Alert and oriented x 3 PSYCHIATRIC:  Normal affect   ASSESSMENT:    1. Ischemic cardiomyopathy ejection fraction 30 to 35% as per patient   2. COVID   3. Coronary artery disease involving native coronary artery of native heart without angina pectoris   4. S/P CABG x 4LIMA - LAD, SVG-RI, SVG-OM, SVG-PDA 2021   5. Smoking   6. Dyslipidemia    PLAN:    In order of  problems listed above:  1. Ischemic cardiomyopathy.  I will try to increase dose of losartan.  He was unable to tolerate Entresto before.  I will check his Chem-7 within next week.  He is already on beta-blocker which I will continue. 2. Coronary disease asymptomatic, status post coronary bypass graft. 3. Smoking he does not smoke anymore he quit smoking when he went to hospital and required bypass surgery. 4. Dyslipidemia I increase his Crestor to 40 mg we will check his fasting lipid profile. 5. We did talk about healthy lifestyle need to exercise on the regular basis which she already does he is also on good diet.  He is doing well.  He does need defibrillator and awaiting evaluation by EP team.  I did review his K PN which show me his data from last year with LDL of 115 HDL 34.  But again now he is on high intensity statin we will check his fasting lipid profile.   Medication Adjustments/Labs and Tests Ordered: Current medicines are reviewed at length with the patient today.  Concerns regarding medicines are outlined above.  No orders of the defined types were placed in this encounter.  Medication changes: No orders of the defined types were placed in this encounter.   Signed, 2022, MD, [facility] 04/21/2021 9:15 AM    [facility]

## 2021-04-21 NOTE — Patient Instructions (Signed)
Medication Instructions:  Your physician has recommended you make the following change in your medication:   INCREASE: Losartan 25 mg twice daily   *If you need a refill on your cardiac medications before your next appointment, please call your pharmacy*   Lab Work: Your physician recommends that you return for lab work 1 week: BMP , LIPID  If you have labs (blood work) drawn today and your tests are completely normal, you will receive your results only by: Marland Kitchen MyChart Message (if you have MyChart) OR . A paper copy in the mail If you have any lab test that is abnormal or we need to change your treatment, we will call you to review the results.   Testing/Procedures: NONE   Follow-Up: At [facility], you and your health needs are our priority.  As part of our continuing mission to provide you with exceptional heart care, we have created designated Provider Care Teams.  These Care Teams include your primary Cardiologist (physician) and Advanced Practice Providers (APPs -  Physician Assistants and Nurse Practitioners) who all work together to provide you with the care you need, when you need it.  We recommend signing up for the patient portal called "MyChart".  Sign up information is provided on this After Visit Summary.  MyChart is used to connect with patients for Virtual Visits (Telemedicine).  Patients are able to view lab/test results, encounter notes, upcoming appointments, etc.  Non-urgent messages can be sent to your provider as well.   To learn more about what you can do with MyChart, go to [email or web address].    Your next appointment:   2 month(s)  The format for your next appointment:   In Person  Provider:   Gypsy Balsam, MD   Other Instructions

## 2021-04-28 LAB — LIPID PANEL
Chol/HDL Ratio: 3.1 ratio (ref 0.0–5.0)
Cholesterol, Total: 107 mg/dL (ref 100–199)
HDL: 35 mg/dL — ABNORMAL LOW (ref >39)
LDL Chol Calc (NIH): 52 mg/dL (ref 0–99)
Triglycerides: 104 mg/dL (ref 0–149)
VLDL Cholesterol Cal: 20 mg/dL (ref 5–40)

## 2021-04-28 LAB — BASIC METABOLIC PANEL
BUN/Creatinine Ratio: 18 (ref 9–20)
BUN: 17 mg/dL (ref 6–24)
CO2: 25 mmol/L (ref 20–29)
Calcium: 9.3 mg/dL (ref 8.7–10.2)
Chloride: 99 mmol/L (ref 96–106)
Creatinine, Ser: 0.97 mg/dL (ref 0.76–1.27)
Glucose: 99 mg/dL (ref 65–99)
Potassium: 4.4 mmol/L (ref 3.5–5.2)
Sodium: 141 mmol/L (ref 134–144)
eGFR: 93 mL/min/{1.73_m2} (ref >59)

## 2021-06-20 ENCOUNTER — Other Ambulatory Visit: Payer: Self-pay

## 2021-06-20 ENCOUNTER — Ambulatory Visit (INDEPENDENT_AMBULATORY_CARE_PROVIDER_SITE_OTHER): Payer: Commercial Managed Care - PPO | Admitting: Cardiology

## 2021-06-20 ENCOUNTER — Encounter: Payer: Self-pay | Admitting: Cardiology

## 2021-06-20 VITALS — BP 145/96 | HR 68 | Ht 69.0 in | Wt 202.8 lb

## 2021-06-20 DIAGNOSIS — I255 Ischemic cardiomyopathy: Secondary | ICD-10-CM | POA: Diagnosis not present

## 2021-06-20 NOTE — Progress Notes (Signed)
Electrophysiology Office Note   Date:  06/20/2021   ID:  Calvin Rodriguez, DOB June 23, 1966, MRN [id number]  PCP:  Galvin Proffer, MD  Cardiologist: Bing Matter Primary Electrophysiologist:  Shawnia Vizcarrondo Jorja Loa, MD    Chief Complaint: CHF   History of Present Illness: Calvin Rodriguez is a 55 y.o. male who is being seen today for the evaluation of CHF at the request of Calvin Lea, MD. Presenting today for electrophysiology evaluation.  He has a history significant for coronary artery disease status post anterior wall MI, chronic systolic heart failure due to ischemic cardiomyopathy, hypertension, hyperlipidemia, tobacco abuse.  He is status post CABG x4.  He had a repeat echo that showed a persistently low ejection fraction.  Today, he denies symptoms of palpitations, chest pain, shortness of breath, orthopnea, PND, lower extremity edema, claudication, dizziness, presyncope, syncope, bleeding, or neurologic sequela. The patient is tolerating medications without difficulties.    Past Medical History:  Diagnosis Date   Atherosclerosis of coronary artery bypass graft(s) without angina pectoris    Cardiac murmur, unspecified 03/09/2021   Chronic diastolic heart failure (HCC) 03/09/2021   Chronic fatigue syndrome 03/09/2021   Coronary artery disease    Coronary atherosclerosis 06/10/2020   COVID 11/24/2020   Dyslipidemia 03/09/2021   Encounter for general adult medical examination without abnormal findings 03/09/2021   Essential hypertension 06/10/2020   GERD (gastroesophageal reflux disease)    Heart attack (HCC) 12/1998   Ischemic cardiomyopathy ejection fraction 30 to 35% as per patient 06/10/2020   Low back pain 03/09/2021   NSTEMI (non-ST elevated myocardial infarction) (HCC) 08/05/2020   Pain of testes 03/09/2021   Postop check 09/08/2020   S/P CABG x 4LIMA - LAD, SVG-RI, SVG-OM, SVG-PDA 2021 08/09/2020   Shoulder joint pain 03/09/2021   Smoking 06/10/2020   Syncope     Testicular hypofunction 03/09/2021   Urinary tract infectious disease 03/09/2021   Past Surgical History:  Procedure Laterality Date   CORONARY ANGIOPLASTY     2000 stent    CORONARY ARTERY BYPASS GRAFT N/A 08/09/2020   Procedure: CORONARY ARTERY BYPASS GRAFTING (CABG) x 4, LIMA TO LAD, SVG TO OM1, SVG TO RAMUS, SVG TO PDA;  Surgeon: Donata Clay, Theron Arista, MD;  Location: [facility] OR;  Service: Open Heart Surgery;  Laterality: N/A;   ENDOVEIN HARVEST OF GREATER SAPHENOUS VEIN Right 08/09/2020   Procedure: ENDOVEIN HARVEST OF GREATER SAPHENOUS VEIN;  Surgeon: Kerin Perna, MD;  Location: [facility] OR;  Service: Open Heart Surgery;  Laterality: Right;   LEFT HEART CATH AND CORONARY ANGIOGRAPHY N/A 08/06/2020   Procedure: LEFT HEART CATH AND CORONARY ANGIOGRAPHY;  Surgeon: Swaziland, Peter M, MD;  Location: [facility] INVASIVE CV LAB;  Service: Cardiovascular;  Laterality: N/A;   STENT PLACEMENT VASCULAR ([facility] HX)  12/1998   TEE WITHOUT CARDIOVERSION N/A 08/09/2020   Procedure: TRANSESOPHAGEAL ECHOCARDIOGRAM (TEE);  Surgeon: Donata Clay, Theron Arista, MD;  Location: [facility] OR;  Service: Open Heart Surgery;  Laterality: N/A;   TONSILLECTOMY       Current Outpatient Medications  Medication Sig Dispense Refill   acetaminophen (TYLENOL) 500 MG tablet Take 1,000 mg by mouth every 6 (six) hours as needed for mild pain.     aspirin EC 81 MG tablet Take 1 tablet (81 mg total) by mouth daily. Swallow whole. 90 tablet 3   B Complex Vitamins (B COMPLEX PO) Take 1 tablet by mouth daily. Unknown strength     carvedilol (COREG) 6.25 MG tablet TAKE 1  TABLET BY MOUTH TWICE DAILY WITH MEALS (Patient taking differently: Take 6.25 mg by mouth 2 (two) times daily with a meal.) 180 tablet 1   Coenzyme Q10 (COQ-10 PO) Take 1 capsule by mouth 2 (two) times daily.     GARLIC PO Take 1 tablet by mouth daily. Unknown strength     losartan (COZAAR) 25 MG tablet Take 1 tablet (25 mg total) by mouth 2 (two) times daily. 180 tablet 1   Multiple Vitamins-Minerals  (MULTIVITAMIN WITH MINERALS) tablet Take 1 tablet by mouth daily. Unknown strenght     Omega-3 Fatty Acids (FISH OIL) 1000 MG CAPS Take 1,000 mg by mouth in the morning and at bedtime.     rosuvastatin (CRESTOR) 40 MG tablet Take 1 tablet by mouth once daily (Patient taking differently: Take 40 mg by mouth daily.) 90 tablet 0   SOYA LECITHIN PO Take 1 capsule by mouth in the morning and at bedtime. Unknown strength     vitamin E 180 MG (400 UNITS) capsule Take 400 Units by mouth daily.     No current facility-administered medications for this visit.    Allergies:   Entresto [sacubitril-valsartan]   Social History:  The patient  reports that he quit smoking about 10 months ago. His smoking use included cigarettes. He smoked an average of 0.75 packs per day. He has never used smokeless tobacco. He reports that he does not drink alcohol and does not use drugs.   Family History:  The patient's family history includes Heart disease in his father and mother.    ROS:  Please see the history of present illness.   Otherwise, review of systems is positive for none.   All other systems are reviewed and negative.    PHYSICAL EXAM: VS:  BP (!) 145/96   Pulse 68   Ht 5\' 9"  (1.753 m)   Wt 202 lb 12.8 oz (92 kg)   SpO2 98%   BMI 29.95 kg/m  , BMI Body mass index is 29.95 kg/m. GEN: Well nourished, well developed, in no acute distress  HEENT: normal  Neck: no JVD, carotid bruits, or masses Cardiac: RRR; no murmurs, rubs, or gallops,no edema  Respiratory:  clear to auscultation bilaterally, normal work of breathing GI: soft, nontender, nondistended, + BS MS: no deformity or atrophy  Skin: warm and dry Neuro:  Strength and sensation are intact Psych: euthymic mood, full affect  EKG:  EKG is ordered today. Personal review of the ekg ordered shows sinus rhythm, rate 68, IVCD  Recent Labs: 08/05/2020: B Natriuretic Peptide 422.9; TSH 1.874 08/06/2020: ALT 27 08/10/2020: Magnesium 2.3 09/01/2020:  Hemoglobin 14.1; Platelets 416 04/28/2021: BUN 17; Creatinine, Ser 0.97; Potassium 4.4; Sodium 141    Lipid Panel     Component Value Date/Time   CHOL 107 04/28/2021 0827   TRIG 104 04/28/2021 0827   HDL 35 (L) 04/28/2021 0827   CHOLHDL 3.1 04/28/2021 0827   CHOLHDL 4.9 08/06/2020 0639   VLDL 17 08/06/2020 0639   LDLCALC 52 04/28/2021 0827     Wt Readings from Last 3 Encounters:  06/20/21 202 lb 12.8 oz (92 kg)  04/21/21 199 lb (90.3 kg)  03/09/21 197 lb (89.4 kg)      Other studies Reviewed: Additional studies/ records that were reviewed today include: TTE 02/16/21  Review of the above records today demonstrates:   1. Left ventricular ejection fraction, by estimation, is 20 to 25%. The  left ventricle has severely decreased function. Please refer to wall  motion description below.   2. Right ventricular systolic function is normal. The right ventricular  size is normal. There is normal pulmonary artery systolic pressure.   3. Left atrial size was moderately dilated.   4. The mitral valve is normal in structure. Mild mitral valve  regurgitation. No evidence of mitral stenosis.    ASSESSMENT AND PLAN:  1.  Chronic systolic heart failure due to ischemic cardiomyopathy: Currently on optimal medical therapy with Coreg and losartan.  His ejection fraction has been low for quite a while.  Is not improved with CABG.  He would thus benefit from ICD implant.  Risks and benefits of been discussed.  Risk include bleeding, tamponade, infection, pneumothorax, lead dislodgment.  The patient would like to discuss this further with his family.  We Somer Trotter touch base again in 2 weeks.  2.  Coronary artery disease: Status post CABG x4.  No current chest pain.  Plan per primary cardiology.  3.  Hyperlipidemia: Continue Crestor per primary cardiology.  Case discussed with primary cardiology, septal infarct  Current medicines are reviewed at length with the patient today.   The patient does not  have concerns regarding his medicines.  The following changes were made today:  none  Labs/ tests ordered today include:  Orders Placed This Encounter  Procedures   EKG 12-Lead     Disposition:   FU with Roshawn Lacina 3 months  Signed, Janey Petron Jorja Loa, MD  06/20/2021 11:50 AM     [facility] [address] [place] [address] [phone] (office) [phone] (fax)

## 2021-06-20 NOTE — Patient Instructions (Signed)
Medication Instructions:  Your physician recommends that you continue on your current medications as directed. Please refer to the Current Medication list given to you today.  *If you need a refill on your cardiac medications before your next appointment, please call your pharmacy*   Lab Work: Pre procedure lab work:  BMET & CBC You do NOT need to be fasting for this blood work.  If you have labs (blood work) drawn today and your tests are completely normal, you will receive your results only by: MyChart Message (if you have MyChart) OR A paper copy in the mail If you have any lab test that is abnormal or we need to change your treatment, we will call you to review the results.   Testing/Procedures: Your physician has recommended that you have a defibrillator inserted. An implantable cardioverter defibrillator (ICD) is a small device that is placed in your chest or, in rare cases, your abdomen. This device uses electrical pulses or shocks to help control life-threatening, irregular heartbeats that could lead the heart to suddenly stop beating (sudden cardiac arrest). Leads are attached to the ICD that goes into your heart. This is done in the hospital and usually requires an overnight stay. Please see the instructions below under "other instructions".  Please call the office when you are ready to schedule this.   Dory Horn, RN     [phone]   Follow-Up: At [facility], you and your health needs are our priority.  As part of our continuing mission to provide you with exceptional heart care, we have created designated Provider Care Teams.  These Care Teams include your primary Cardiologist (physician) and Advanced Practice Providers (APPs -  Physician Assistants and Nurse Practitioners) who all work together to provide you with the care you need, when you need it.  Your next appointment:   2 week(s) after your procedure  The format for your next appointment:   In  Person  Provider:   Device clinic for a wound check    Thank you for choosing [facility]!!   Dory Horn, RN [phone]   Other Instructions   Implantable Device Instructions  You are scheduled for: Implantable cardiac defibrillator on ___________ with Dr. Elberta Fortis.  1.   Pre procedure testing-             A.  LAB WORK--- On ____________  for your pre procedure blood work.  You do NOT need to be fasting.  On the day of your procedure ____________ you will go to [facility] hospital [phone]) at ___________.  You will go to the main entrance [address] [employer]) and enter where the [employer] parking staff are.  You will check in at ADMITTING.  You may have one support person come in to the hospital with you.  They will be asked to wait in the waiting room.   3.   Do not eat or drink after midnight prior to your procedure.   4.   On the morning of your procedure do NOT take any medication.  5.  The night before your procedure and the morning of your procedure scrub your neck/chest with surgical scrub.  See instruction letter.   5.  Plan for an overnight stay, but you may be discharged home after your procedure. If you use your phone frequently bring your phone charger, in case you have to stay.  If you are discharged after your procedure you will need someone to drive you home and  be with your for 24 hours after your procedure.   6.  You will follow up with the [facility] Device clinic 10-14 days after your procedure. You will follow up with Dr. Elberta Fortis 91 days after your procedure.  These appointments will be made for you.  7. FYI: For your safety, and to allow [place] to monitor your vital signs accurately during the surgery/procedure we request that if you have artificial nails, gel coating, SNS etc. Please have those removed prior to your surgery/procedure. Not having the nail coverings /polish removed may result in cancellation or delay of your surgery/procedure.   * If you  have ANY questions after you get home, please call the office [phone] and ask for Abrian Hanover RN or send a MyChart message.     Cardioverter Defibrillator Implantation An implantable cardioverter defibrillator (ICD) is a device that identifies and corrects abnormal heart rhythms. Cardioverter defibrillator implantation is a surgery to place an ICD under the skin in the chest or abdomen. An ICD has a battery, a small computer (pulse generator), and wires (leads) that go into the heart. The ICD detects and corrects two types of dangerous irregular heart rhythms (arrhythmias): A rapid heart rhythm in the lower chambers of the heart (ventricles). This is called ventricular tachycardia. The ventricles contracting in an uncoordinated way. This is called ventricular fibrillation. There are different types of ICDs, and the electrical signals from the ICD can be programmed differently based on the condition being treated. The electrical signals from the ICD can be low-energy pulses, high-energy shocks, or a combination of the two. The low-energy pulses are generally used to restore the heartbeat to normal when it is either too slow (bradycardia) or too fast. These pulses are painless. The high-energy shocks are used to treat abnormal rhythms such as ventricular tachycardia or ventricularfibrillation. This shock may feel like a strong jolt in the chest. Your health care provider may recommend an ICD if you have: Had a ventricular arrhythmia in the past. A damaged heart because of a disease or heart condition. A weakened heart muscle from a heart attack or cardiac arrest. A congenital heart defect. Long QT syndrome, which is a disorder of the heart's electrical system. Brugada syndrome, which is a condition that causes a disruption of the heart's normal rhythm. Tell a health care provider about: Any allergies you have. All medicines you are taking, including vitamins, herbs, eye drops, creams, and  over-the-counter medicines. Any problems you or family members have had with anesthetic medicines. Any blood disorders you have. Any surgeries you have had. Any medical conditions you have. Whether you are pregnant or may be pregnant. What are the risks? Generally, this is a safe procedure. However, problems may occur, including: Infection. Bleeding. Allergic reactions to medicines used during the procedure. Blood clots. Swelling or bruising. Damage to nearby structures or organs, such as nerves, lungs, blood vessels, or the heart where the ICD leads or pulse generator is implanted. What happens before the procedure? Staying hydrated Follow instructions from your health care provider about hydration, which may include: Up to 2 hours before the procedure - you may continue to drink clear liquids, such as water, clear fruit juice, black coffee, and plain tea.  Eating and drinking restrictions Follow instructions from your health care provider about eating and drinking, which may include: 8 hours before the procedure - stop eating heavy meals or foods, such as meat, fried foods, or fatty foods. 6 hours before the procedure - stop eating  light meals or foods, such as toast or cereal. 6 hours before the procedure - stop drinking milk or drinks that contain milk. 2 hours before the procedure - stop drinking clear liquids. Medicines Ask your health care provider about: Changing or stopping your regular medicines. This is especially important if you are taking diabetes medicines or blood thinners. Taking medicines such as aspirin and ibuprofen. These medicines can thin your blood. Do not take these medicines unless your health care provider tells you to take them. Taking over-the-counter medicines, vitamins, herbs, and supplements. Tests You may have an exam or testing. These may include: Blood tests. A test to check the electrical signals in your heart (electrocardiogram, ECG). Imaging  tests, such as a chest X-ray. Echocardiogram. This is an ultrasound of your heart to evaluate your heart structures and function. An event monitor or Holter monitor to wear at home. General instructions Do not use any products that contain nicotine or tobacco for at least 4 weeks before the procedure. These products include cigarettes, chewing tobacco, and vaping devices, such as e-cigarettes. If you need help quitting, ask your health care provider. Ask your health care provider: How your procedure site will be marked. What steps will be taken to help prevent infection. These may include: Removing hair at the surgery site. Washing skin with a germ-killing soap. Taking antibiotic medicine. You may be asked to shower with a germ-killing soap. Plan to have a responsible adult take you home from the hospital or clinic. What happens during the procedure?  Small monitors will be put on your body. They will be used to check your heart rate, blood pressure, and oxygen level. A pair of sticky pads (defibrillator pads) may be placed on your back and chest. These pads are able to pace your heart as needed during the procedure. An IV will be inserted into one of your veins. You will be given one or more of the following: A medicine to help you relax (sedative). A medicine to numb the area (local anesthetic). A medicine to make you fall asleep(general anesthetic). A small incision will be made to create a deep pocket under the skin of your chest or abdomen. Leads will be guided through a blood vessel into your heart and attached to your heart muscles. Depending on the ICD, the leads may go into one ventricle, or they may go into both ventricles and into an upper chamber of the heart. An X-ray machine (fluoroscope) will be used to help guide the leads. The other end of the leads will be attached to the pulse generator. The pulse generator will be placed into the pocket under the skin. The ICD will be  tested, and your health care provider will program the ICD for the condition being treated. The incision will be closed with stitches (sutures), skin glue, adhesive strips, or staples. A bandage (dressing) will be placed over the incision. The procedure may vary among health care providers and hospitals. What happens after the procedure? Your blood pressure, heart rate, breathing rate, and blood oxygen level will be monitored until you leave the hospital or clinic. Your health care provider will also monitor your ICD to make sure it is working properly. A chest X-ray will be taken to check that the ICD is in the right place. Do not raise the arm on the side of your procedure higher than your shoulder for as long as told by your health care provider. This is usually at least 6 weeks. You may  be given an identification card explaining that you have an ICD. You will be given a remote home monitoring device to use with your ICD to allow your device to communicate with your clinic. Summary An implantable cardioverter defibrillator (ICD) is a device that identifies and corrects abnormal heart rhythms. Cardioverter defibrillator implantation is a surgery to place an ICD under the skin in the chest or abdomen. An ICD consists of a battery, a small computer (pulse generator), and wires (leads) that go into the heart. During the procedure, the ICD will be tested, and your health care provider will program the ICD for the condition being treated. After the procedure, a chest X-ray will be taken to check that the ICD is in the right place. This information is not intended to replace advice given to you by your health care provider. Make sure you discuss any questions you have with your healthcare provider. Document Revised: 05/26/2020 Document Reviewed: 05/26/2020 Elsevier Patient Education  2022 [employer]

## 2021-07-08 ENCOUNTER — Ambulatory Visit: Payer: Commercial Managed Care - PPO | Admitting: Cardiology

## 2021-07-25 ENCOUNTER — Institutional Professional Consult (permissible substitution): Payer: Commercial Managed Care - PPO | Admitting: Cardiology

## 2021-07-27 ENCOUNTER — Other Ambulatory Visit: Payer: Self-pay | Admitting: Cardiology

## 2021-10-11 ENCOUNTER — Other Ambulatory Visit: Payer: Self-pay | Admitting: Cardiology

## 2021-11-17 ENCOUNTER — Other Ambulatory Visit: Payer: Self-pay | Admitting: Cardiology

## 2022-01-10 ENCOUNTER — Other Ambulatory Visit: Payer: Self-pay | Admitting: Cardiology

## 2022-04-10 ENCOUNTER — Other Ambulatory Visit: Payer: Self-pay | Admitting: Cardiology

## 2022-07-11 ENCOUNTER — Other Ambulatory Visit: Payer: Self-pay | Admitting: Cardiology

## 2022-08-08 ENCOUNTER — Other Ambulatory Visit: Payer: Self-pay | Admitting: Cardiology

## 2022-08-16 IMAGING — DX DG CHEST 1V PORT
1 series · 1 of 1 positions shown · non-contrast
Comparison: Portable exam 6624 hours compared to preoperative exam
of 08/05/2020

CLINICAL DATA: Post CABG

EXAM:
PORTABLE CHEST 1 VIEW

[chest]
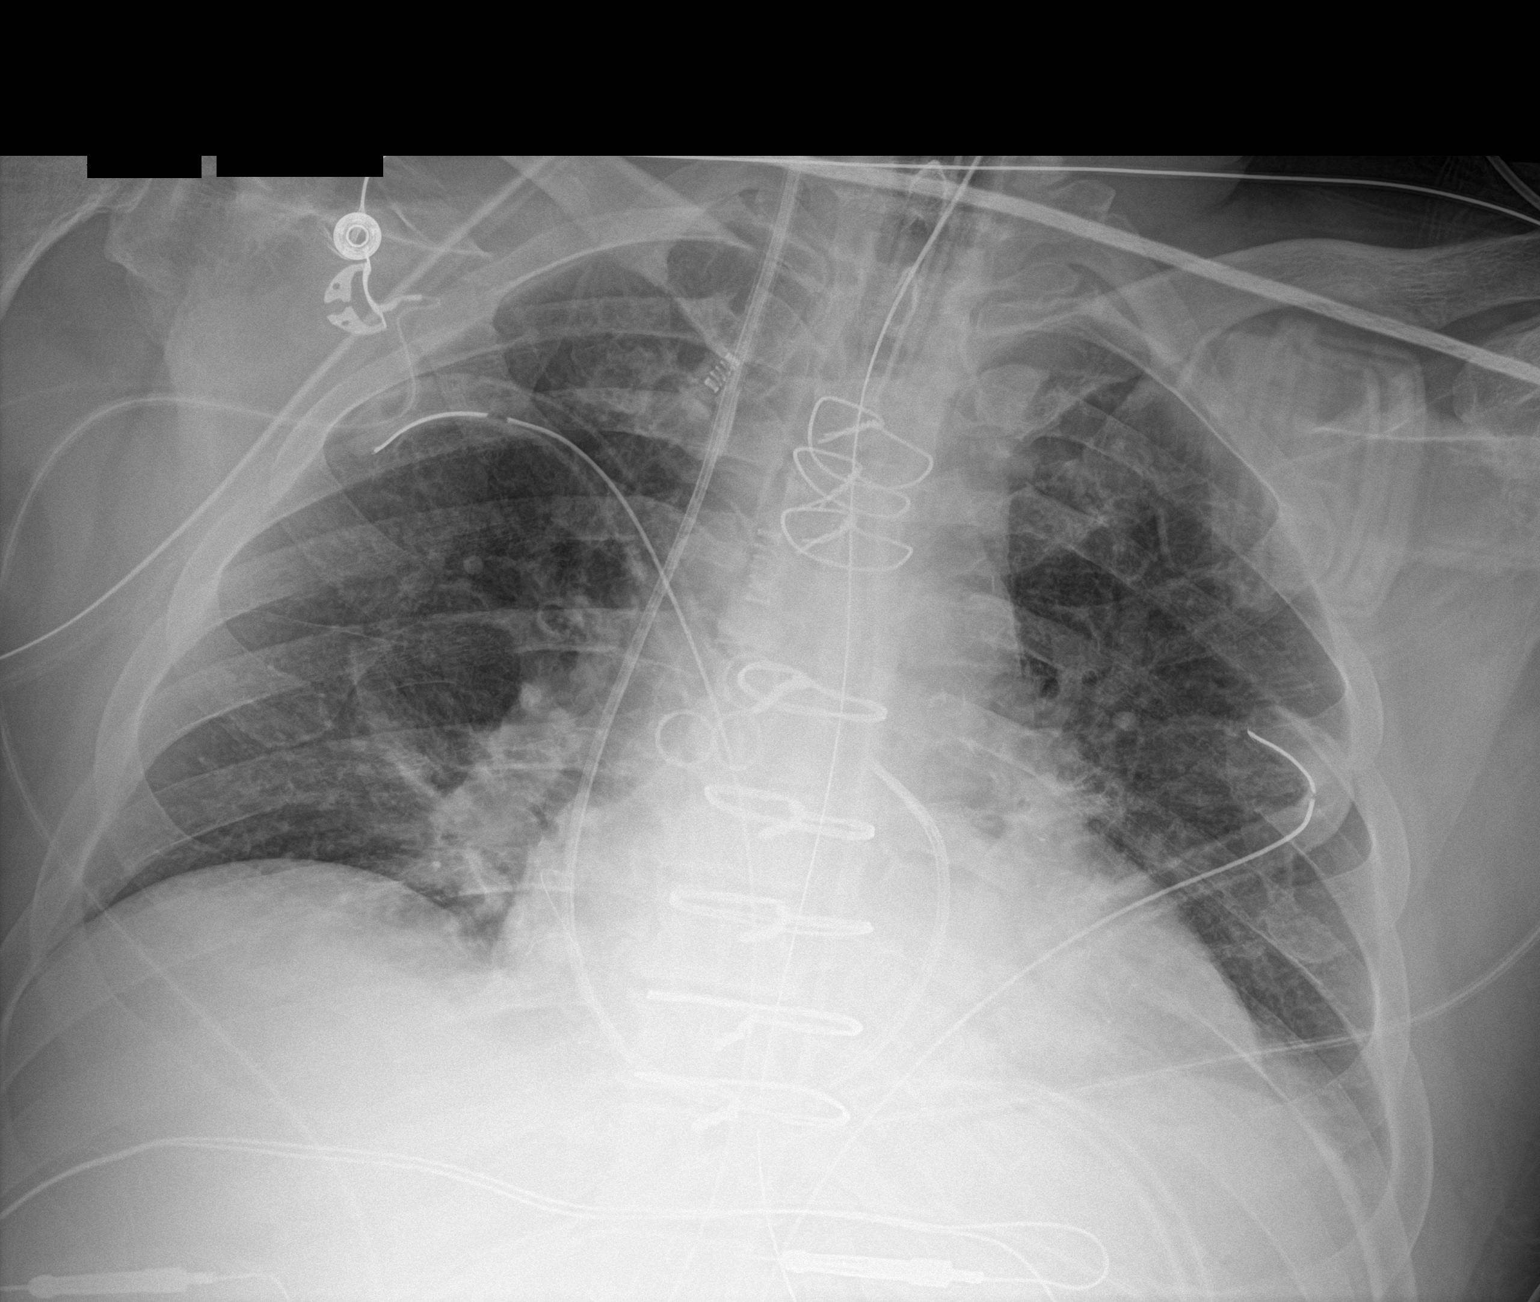

[1 of 1 positions shown; findings below may reference images not displayed]

FINDINGS: Tip of endotracheal tube projects approximately 6 cm above carina.

Nasogastric tube extends into stomach.

BILATERAL thoracostomy tubes present.

RIGHT jugular Swan-Ganz catheter tip projecting over RIGHT pulmonary
artery proximally.

Epicardial pacing wires.

Enlargement of cardiac silhouette post CABG.

Mediastinal contours normal.

Bibasilar atelectasis.

No pleural effusion or pneumothorax.
IMPRESSION: Postoperative changes as above.

Bibasilar atelectasis.

## 2022-08-18 IMAGING — DX DG CHEST 1V PORT
1 series · 1 of 1 positions shown · non-contrast
Comparison: August 10, 2020.

CLINICAL DATA: Status post coronary bypass graft.

EXAM:
PORTABLE CHEST 1 VIEW

[chest ap]
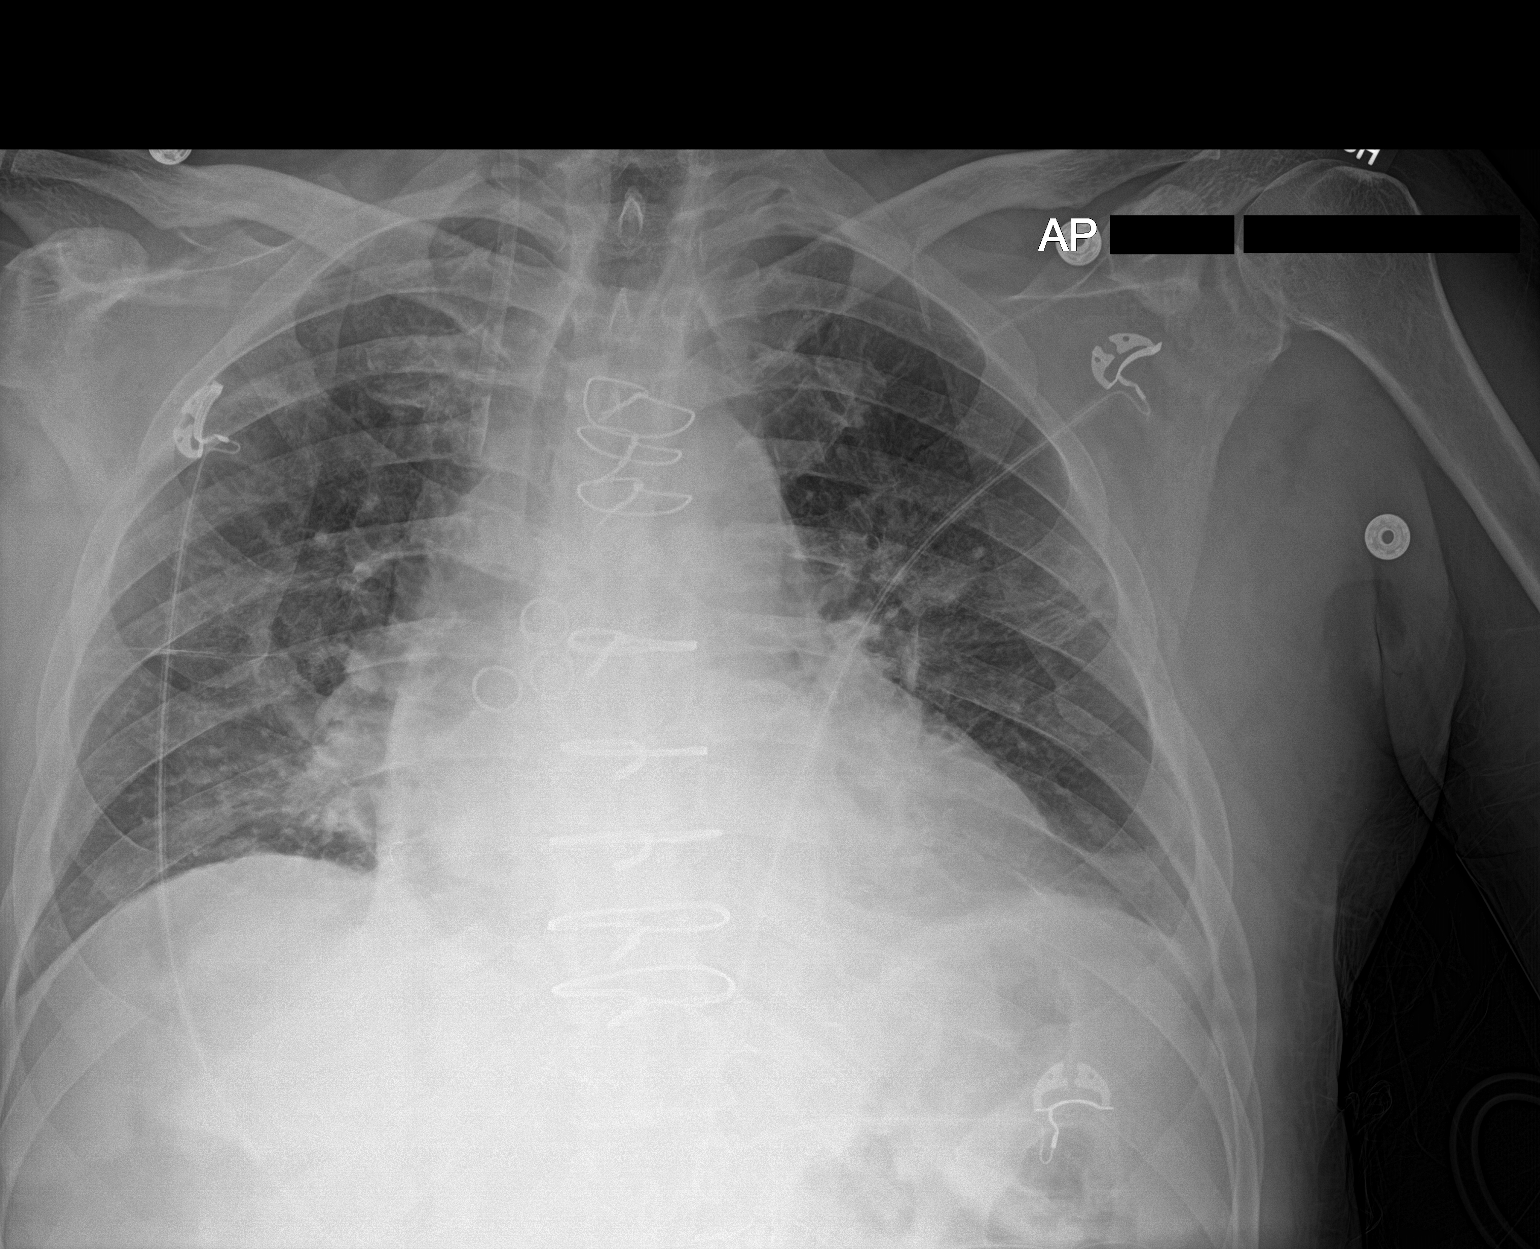

[1 of 1 positions shown; findings below may reference images not displayed]

FINDINGS: Stable cardiomediastinal silhouette. Swan-Ganz catheter has been
removed. Left-sided chest tube has been removed. No pneumothorax is
noted. Mild bibasilar subsegmental atelectasis is noted. No
significant pleural effusion is noted. Bony thorax is unremarkable.
IMPRESSION: No pneumothorax status post left-sided chest tube removal. Mild
bibasilar subsegmental atelectasis.

## 2022-08-19 IMAGING — DX DG CHEST 1V PORT
1 series · 1 of 1 positions shown · non-contrast
Comparison: Chest CT August 06, 2020, chest x-ray of August 11, 2020

CLINICAL DATA: Post sternotomy, chest soreness

EXAM:
PORTABLE CHEST 1 VIEW

[chest ap]
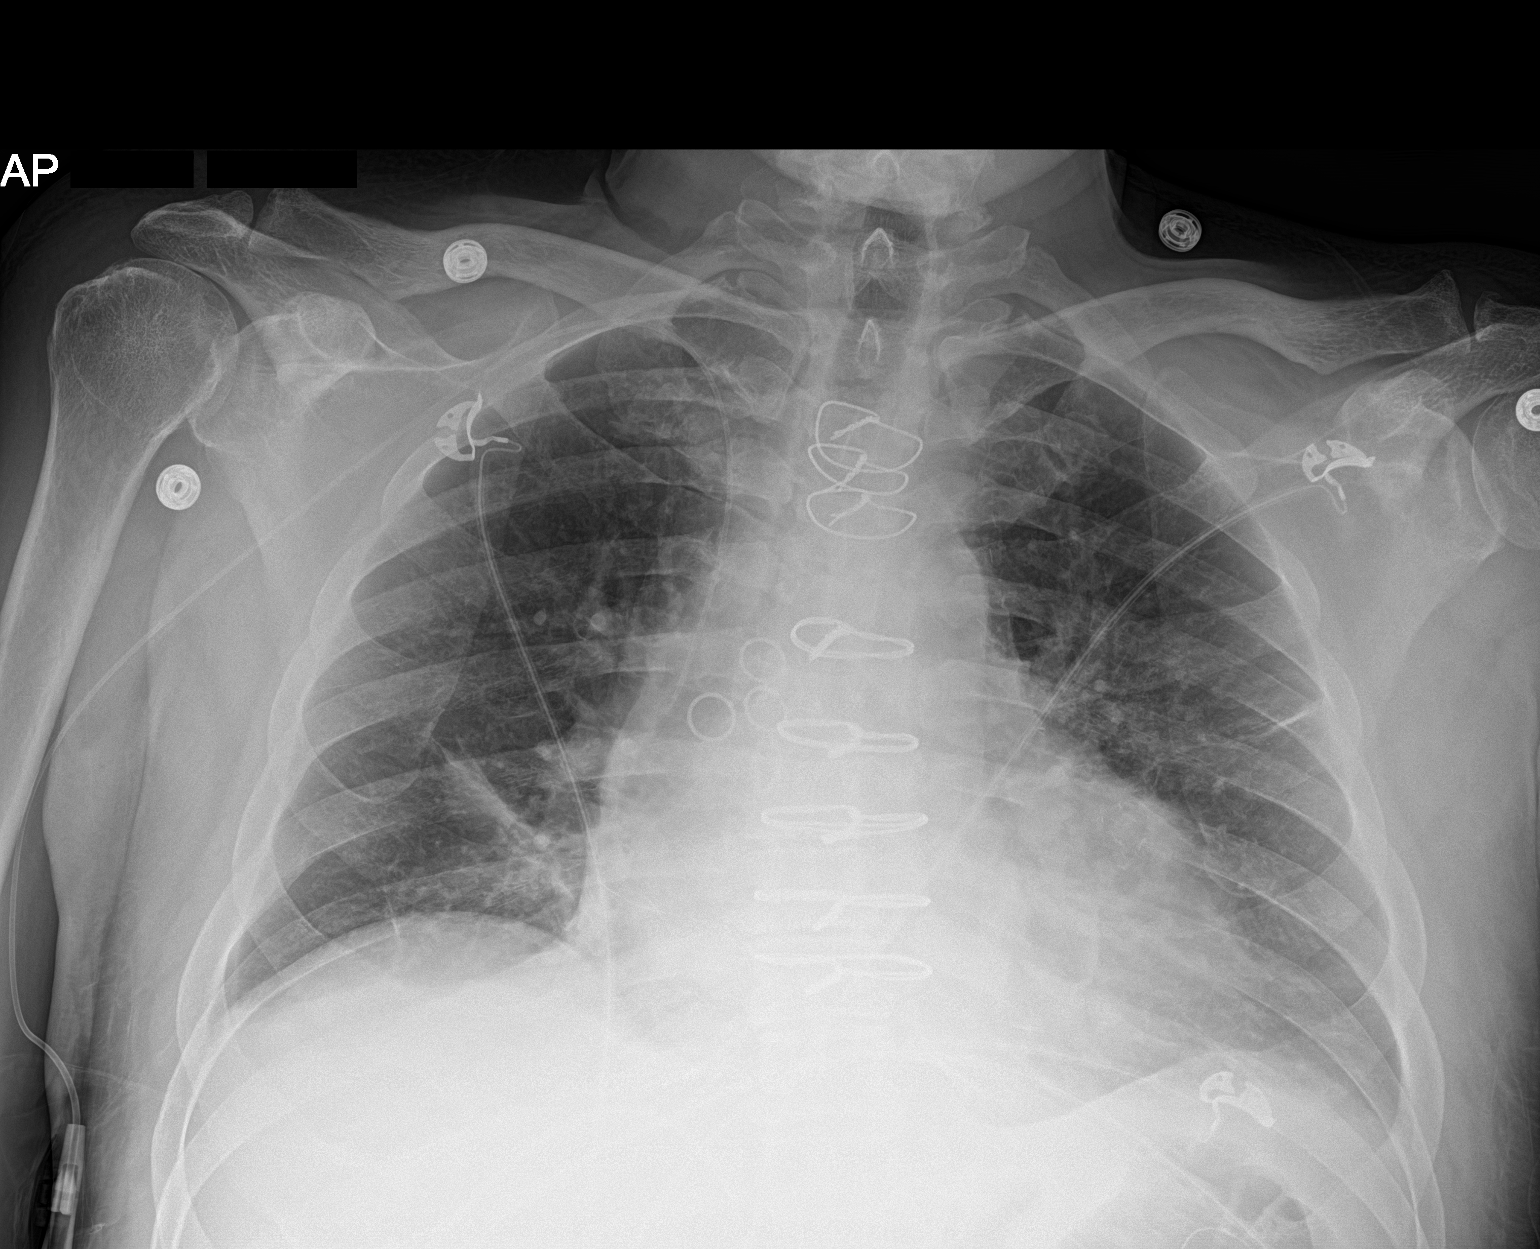

[1 of 1 positions shown; findings below may reference images not displayed]

FINDINGS: RIGHT-sided internal jugular vascular sheath has been removed.
RIGHT-sided PICC line placed with the tip at the caval to atrial
junction.

Post sternotomy and changes of CABG as before.

Cardiomediastinal contours are stable.

Improved aeration since the previous exam still with linear
opacities at the RIGHT lung base and probable small bilateral
pleural fluid effusions.

On limited assessment no acute skeletal process.
IMPRESSION: 1. Improved aeration since the previous exam still with linear
opacities at the RIGHT lung base, likely atelectasis associated with
small bilateral pleural effusions.
2. Interval removal of the RIGHT-sided internal jugular vascular
sheath and placement of RIGHT-sided PICC line.

## 2022-08-28 ENCOUNTER — Other Ambulatory Visit: Payer: Self-pay | Admitting: Cardiology

## 2022-09-13 ENCOUNTER — Other Ambulatory Visit: Payer: Self-pay | Admitting: Cardiology

## 2022-09-14 MED ORDER — LOSARTAN POTASSIUM 25 MG PO TABS: 25.0000 mg | ORAL_TABLET | Freq: Two times a day (BID) | ORAL | 0 refills | Status: DC

## 2022-09-14 MED ORDER — CARVEDILOL 6.25 MG PO TABS: 6.2500 mg | ORAL_TABLET | Freq: Two times a day (BID) | ORAL | 0 refills | Status: DC

## 2022-09-14 NOTE — Telephone Encounter (Signed)
*  STAT* If patient is at the pharmacy, call can be transferred to refill team.   1. Which medications need to be refilled? (please list name of each medication and dose if known) Losartan and Carvedilol  2. Which pharmacy/location (including street and city if local pharmacy) is medication to be sent to? [facility] [address] [place]  3. Do they need a 30 day or 90 day supply? Enough until his appointment on 12-19-22

## 2022-09-14 NOTE — Telephone Encounter (Signed)
Refill for 90 days sent with message patient must keep scheduled appointment 12/19/2022

## 2022-09-30 ENCOUNTER — Other Ambulatory Visit: Payer: Self-pay | Admitting: Cardiology

## 2022-10-22 ENCOUNTER — Other Ambulatory Visit: Payer: Self-pay | Admitting: Cardiology

## 2022-12-15 ENCOUNTER — Other Ambulatory Visit: Payer: Self-pay

## 2022-12-19 ENCOUNTER — Ambulatory Visit: Payer: Commercial Managed Care - PPO | Attending: Cardiology | Admitting: Cardiology

## 2022-12-19 ENCOUNTER — Encounter: Payer: Self-pay | Admitting: Cardiology

## 2022-12-19 VITALS — BP 136/82 | HR 67 | Ht 69.0 in | Wt 190.2 lb

## 2022-12-19 DIAGNOSIS — IMO0001 Reserved for inherently not codable concepts without codable children: Secondary | ICD-10-CM

## 2022-12-19 DIAGNOSIS — Z951 Presence of aortocoronary bypass graft: Secondary | ICD-10-CM

## 2022-12-19 DIAGNOSIS — I255 Ischemic cardiomyopathy: Secondary | ICD-10-CM

## 2022-12-19 DIAGNOSIS — I10 Essential (primary) hypertension: Secondary | ICD-10-CM

## 2022-12-19 DIAGNOSIS — E785 Hyperlipidemia, unspecified: Secondary | ICD-10-CM

## 2022-12-19 DIAGNOSIS — R0600 Dyspnea, unspecified: Secondary | ICD-10-CM

## 2022-12-19 DIAGNOSIS — F17200 Nicotine dependence, unspecified, uncomplicated: Secondary | ICD-10-CM

## 2022-12-19 DIAGNOSIS — R0609 Other forms of dyspnea: Secondary | ICD-10-CM

## 2022-12-19 MED ORDER — LOSARTAN POTASSIUM 25 MG PO TABS: 25.0000 mg | ORAL_TABLET | Freq: Every day | ORAL | 3 refills | Status: DC

## 2022-12-19 NOTE — Patient Instructions (Signed)
Medication Instructions:   START: Losartan 25mg  1 tablet daily   Lab Work: Your physician recommends that you return for lab work in: when you are fasting You need to have labs done when you are fasting.  You can come Monday through Friday 8:30 am to 12:00 pm and 1:15 to 4:30. You do not need to make an appointment as the order has already been placed. The labs you are going to have done are Lipids.    Testing/Procedures: Your physician has requested that you have an echocardiogram. Echocardiography is a painless test that uses sound waves to create images of your heart. It provides your doctor with information about the size and shape of your heart and how well your heart's chambers and valves are working. This procedure takes approximately one hour. There are no restrictions for this procedure. Please do NOT wear cologne, perfume, aftershave, or lotions (deodorant is allowed). Please arrive 15 minutes prior to your appointment time.    Follow-Up: At [facility], you and your health needs are our priority.  As part of our continuing mission to provide you with exceptional heart care, we have created designated Provider Care Teams.  These Care Teams include your primary Cardiologist (physician) and Advanced Practice Providers (APPs -  Physician Assistants and Nurse Practitioners) who all work together to provide you with the care you need, when you need it.  We recommend signing up for the patient portal called "MyChart".  Sign up information is provided on this After Visit Summary.  MyChart is used to connect with patients for Virtual Visits (Telemedicine).  Patients are able to view lab/test results, encounter notes, upcoming appointments, etc.  Non-urgent messages can be sent to your provider as well.   To learn more about what you can do with MyChart, go to [email or web address].    Your next appointment:   3 month(s)  The format for your next appointment:   In Person  Provider:    Jenne Campus, MD    Other Instructions NA

## 2022-12-19 NOTE — Progress Notes (Unsigned)
Cardiology Office Note:    Date:  12/19/2022   ID:  Calvin Rodriguez, DOB 10-02-1966, MRN [id number]  PCP:  Galvin Proffer, MD  Cardiologist:  Gypsy Balsam, MD    Referring MD: Galvin Proffer, MD   Chief Complaint  Patient presents with   Follow-up    History of Present Illness:    Calvin Rodriguez is a 57 y.o. male   with past medical history significant for coronary artery disease, 21 years ago I treated him for acute anterior wall myocardial infarction, recently presented to my office because of episode of syncope he was found to have significantly diminished left ventricle ejection fraction, troponin I done at that time was significantly elevated, he was brought to [facility] cardiac catheterization was done which showed significant triple-vessel disease, he was also found to have significantly diminished left ventricle ejection fraction 25% range, he was offered coronary artery bypass graft which was done just 2 weeks ago.  LIMA to LAD SVG to ramus intermedius, SVG to obtuse marginal, SVG to PDA.  He comes to my office today after not being seen for a year he said he is doing great he is asymptomatic, denies have any chest pain, tightness, pressure, burning in the chest.  He does not smoke however he stopped taking his losartan he ran out of it, he also stopped taking statin because he said he does not want to take it he does not think it is a problem.  Past Medical History:  Diagnosis Date   Atherosclerosis of coronary artery bypass graft(s) without angina pectoris    Cardiac murmur, unspecified 03/09/2021   Chronic diastolic heart failure (HCC) 03/09/2021   Chronic fatigue syndrome 03/09/2021   Coronary artery disease    Coronary atherosclerosis 06/10/2020   COVID 11/24/2020   Dyslipidemia 03/09/2021   Encounter for general adult medical examination without abnormal findings 03/09/2021   Essential hypertension 06/10/2020   GERD (gastroesophageal reflux disease)    Heart  attack (HCC) 12/1998   Ischemic cardiomyopathy ejection fraction 30 to 35% as per patient 06/10/2020   Low back pain 03/09/2021   NSTEMI (non-ST elevated myocardial infarction) (HCC) 08/05/2020   Pain of testes 03/09/2021   Postop check 09/08/2020   S/P CABG x 4LIMA - LAD, SVG-RI, SVG-OM, SVG-PDA 2021 08/09/2020   Shoulder joint pain 03/09/2021   Smoking 06/10/2020   Syncope    Testicular hypofunction 03/09/2021   Urinary tract infectious disease 03/09/2021    Past Surgical History:  Procedure Laterality Date   CORONARY ANGIOPLASTY     2000 stent    CORONARY ARTERY BYPASS GRAFT N/A 08/09/2020   Procedure: CORONARY ARTERY BYPASS GRAFTING (CABG) x 4, LIMA TO LAD, SVG TO OM1, SVG TO RAMUS, SVG TO PDA;  Surgeon: Donata Clay, Theron Arista, MD;  Location: [facility] OR;  Service: Open Heart Surgery;  Laterality: N/A;   ENDOVEIN HARVEST OF GREATER SAPHENOUS VEIN Right 08/09/2020   Procedure: ENDOVEIN HARVEST OF GREATER SAPHENOUS VEIN;  Surgeon: Kerin Perna, MD;  Location: [facility] OR;  Service: Open Heart Surgery;  Laterality: Right;   LEFT HEART CATH AND CORONARY ANGIOGRAPHY N/A 08/06/2020   Procedure: LEFT HEART CATH AND CORONARY ANGIOGRAPHY;  Surgeon: Swaziland, Peter M, MD;  Location: [facility] INVASIVE CV LAB;  Service: Cardiovascular;  Laterality: N/A;   STENT PLACEMENT VASCULAR ([facility] HX)  12/1998   TEE WITHOUT CARDIOVERSION N/A 08/09/2020   Procedure: TRANSESOPHAGEAL ECHOCARDIOGRAM (TEE);  Surgeon: Donata Clay, Theron Arista, MD;  Location: [facility] OR;  Service: Open Heart Surgery;  Laterality: N/A;   TONSILLECTOMY      Current Medications: Current Meds  Medication Sig   acetaminophen (TYLENOL) 500 MG tablet Take 1,000 mg by mouth every 6 (six) hours as needed for mild pain.   aspirin EC 81 MG tablet Take 1 tablet (81 mg total) by mouth daily. Swallow whole.   B Complex Vitamins (B COMPLEX PO) Take 1 tablet by mouth daily. Unknown strength   carvedilol (COREG) 6.25 MG tablet Take 1 tablet (6.25 mg total) by mouth 2 (two) times daily with  a meal. Patient must keep 12/19/22 appointment for further refills. 3 rd/final attempt   Coenzyme Q10 (COQ-10 PO) Take 1 capsule by mouth 2 (two) times daily.   GARLIC PO Take 1 tablet by mouth daily. Unknown strength   Multiple Vitamins-Minerals (MULTIVITAMIN WITH MINERALS) tablet Take 1 tablet by mouth daily. Unknown strenght   Omega-3 Fatty Acids (FISH OIL) 1000 MG CAPS Take 1,000 mg by mouth in the morning and at bedtime.   rosuvastatin (CRESTOR) 40 MG tablet Take 1 tablet (40 mg total) by mouth daily.   SOYA LECITHIN PO Take 1 capsule by mouth in the morning and at bedtime. Unknown strength   vitamin E 180 MG (400 UNITS) capsule Take 400 Units by mouth daily.   [DISCONTINUED] losartan (COZAAR) 25 MG tablet Take 1 tablet (25 mg total) by mouth 2 (two) times daily. Patient needs an appointment for further refills. 3 rd/final attempt     Allergies:   Entresto [sacubitril-valsartan]   Social History   Socioeconomic History   Marital status: Single    Spouse name: Not on file   Number of children: Not on file   Years of education: Not on file   Highest education level: Not on file  Occupational History   Not on file  Tobacco Use   Smoking status: Former    Packs/day: 0.75    Types: Cigarettes    Quit date: 08/05/2020    Years since quitting: 2.3   Smokeless tobacco: Never  Vaping Use   Vaping Use: Never used  Substance and Sexual Activity   Alcohol use: Never   Drug use: Never   Sexual activity: Not Currently  Other Topics Concern   Not on file  Social History Narrative   Not on file   Social Determinants of Health   Financial Resource Strain: Not on file  Food Insecurity: Not on file  Transportation Needs: Not on file  Physical Activity: Not on file  Stress: Not on file  Social Connections: Not on file     Family History: The patient's family history includes Heart disease in his father and mother. ROS:   Please see the history of present illness.    All 14 point  review of systems negative except as described per history of present illness  EKGs/Labs/Other Studies Reviewed:      Recent Labs: No results found for requested labs within last 365 days.  Recent Lipid Panel    Component Value Date/Time   CHOL 107 04/28/2021 0827   TRIG 104 04/28/2021 0827   HDL 35 (L) 04/28/2021 0827   CHOLHDL 3.1 04/28/2021 0827   CHOLHDL 4.9 08/06/2020 0639   VLDL 17 08/06/2020 0639   LDLCALC 52 04/28/2021 0827    Physical Exam:    VS:  BP 136/82 (BP Location: Left Arm, Patient Position: Sitting)   Pulse 67   Ht 5\' 9"  (1.753 m)   Wt 190 lb 3.2  oz (86.3 kg)   SpO2 97%   BMI 28.09 kg/m     Wt Readings from Last 3 Encounters:  12/19/22 190 lb 3.2 oz (86.3 kg)  06/20/21 202 lb 12.8 oz (92 kg)  04/21/21 199 lb (90.3 kg)     GEN:  Well nourished, well developed in no acute distress HEENT: Normal NECK: No JVD; No carotid bruits LYMPHATICS: No lymphadenopathy CARDIAC: RRR, no murmurs, no rubs, no gallops RESPIRATORY:  Clear to auscultation without rales, wheezing or rhonchi  ABDOMEN: Soft, non-tender, non-distended MUSCULOSKELETAL:  No edema; No deformity  SKIN: Warm and dry LOWER EXTREMITIES: no swelling NEUROLOGIC:  Alert and oriented x 3 PSYCHIATRIC:  Normal affect   ASSESSMENT:    1. Cardiomyopathy, ischemic   2. S/P CABG x 4LIMA - LAD, SVG-RI, SVG-OM, SVG-PDA 2021   3. Ischemic cardiomyopathy ejection fraction 30 to 35% as per patient   4. Essential hypertension   5. Dyslipidemia   6. Smoking    PLAN:    In order of problems listed above:  Coronary artery disease status post coronary bypass graft.  I am very saddened by the fact that he is now taking all his medications the way he should.  At least he is taking aspirin and carvedilol.  I will check his ejection fraction with doing echocardiogram and if it still diminished will insist on increasing his medical therapy.  In the meantime I will give him prescription for  losartan. Dyslipidemia he refused to take statin he said he does not think he needed I spent great deal of time spent taking was the reason for taking it he agreed to have cholesterol rechecked and then we continue this discussion. He did have visit with our EP team with consideration for ICD he does not want this.  I will recheck his echocardiogram based on that we will decide if anything pushing more towards potentially getting ICD. History of smoking: Does not smoke anymore   Medication Adjustments/Labs and Tests Ordered: Current medicines are reviewed at length with the patient today.  Concerns regarding medicines are outlined above.  Orders Placed This Encounter  Procedures   EKG 12-Lead   Medication changes: No orders of the defined types were placed in this encounter.   Signed, Georgeanna Lea, MD, [facility] 12/19/2022 4:42 PM    [facility]

## 2022-12-25 ENCOUNTER — Telehealth: Payer: Self-pay | Admitting: Cardiology

## 2022-12-25 MED ORDER — CARVEDILOL 6.25 MG PO TABS: 6.2500 mg | ORAL_TABLET | Freq: Two times a day (BID) | ORAL | 3 refills | Status: DC

## 2022-12-25 NOTE — Telephone Encounter (Signed)
Patient needs refill on carvedilol

## 2022-12-26 ENCOUNTER — Other Ambulatory Visit: Payer: Commercial Managed Care - PPO

## 2022-12-26 LAB — LIPID PANEL
Chol/HDL Ratio: 4.3 ratio (ref 0.0–5.0)
Cholesterol, Total: 173 mg/dL (ref 100–199)
HDL: 40 mg/dL (ref >39)
LDL Chol Calc (NIH): 107 mg/dL — ABNORMAL HIGH (ref 0–99)
Triglycerides: 146 mg/dL (ref 0–149)
VLDL Cholesterol Cal: 26 mg/dL (ref 5–40)

## 2022-12-26 LAB — ALT: ALT: 17 IU/L (ref 0–44)

## 2022-12-26 LAB — AST: AST: 23 IU/L (ref 0–40)

## 2023-01-15 ENCOUNTER — Ambulatory Visit: Payer: Self-pay

## 2023-02-02 ENCOUNTER — Telehealth: Payer: Self-pay

## 2023-02-02 NOTE — Telephone Encounter (Signed)
Pt viewed results on My Chart

## 2023-03-29 ENCOUNTER — Ambulatory Visit: Payer: Self-pay | Admitting: Cardiology

## 2023-05-22 ENCOUNTER — Ambulatory Visit: Payer: Self-pay | Admitting: Cardiology

## 2023-06-19 ENCOUNTER — Other Ambulatory Visit: Payer: Self-pay

## 2023-06-26 ENCOUNTER — Ambulatory Visit: Payer: Self-pay | Admitting: Cardiology

## 2024-01-09 ENCOUNTER — Other Ambulatory Visit: Payer: Self-pay | Admitting: Cardiology

## 2024-02-03 ENCOUNTER — Other Ambulatory Visit: Payer: Self-pay | Admitting: Cardiology

## 2024-02-27 ENCOUNTER — Other Ambulatory Visit (HOSPITAL_COMMUNITY): Payer: Self-pay

## 2024-02-27 ENCOUNTER — Telehealth: Payer: Self-pay | Admitting: Cardiology

## 2024-02-27 MED ORDER — CARVEDILOL 6.25 MG PO TABS
6.2500 mg | ORAL_TABLET | Freq: Two times a day (BID) | ORAL | 1 refills | Status: DC
  Filled 2024-02-27: qty 30, 15d supply, fill #0

## 2024-02-27 NOTE — Telephone Encounter (Signed)
 Sent prescription with message to keep 05/22 appt for additional refills.

## 2024-02-27 NOTE — Telephone Encounter (Signed)
*  STAT* If patient is at the pharmacy, call can be transferred to refill team.   1. Which medications need to be refilled? (please list name of each medication and dose if known) carvedilol (COREG) 6.25 MG tablet    2. Would you like to learn more about the convenience, safety, & potential cost savings by using the [facility]? No   3. Are you open to using the [facility] (Type [facility]. ) No   4. Which pharmacy/location (including street and city if local pharmacy) is medication to be sent to? [facility] - , [place] - [address]    5. Do they need a 30 day or 90 day supply? 30 day  Pt has scheduled appt on 5/22

## 2024-03-03 ENCOUNTER — Other Ambulatory Visit (HOSPITAL_COMMUNITY): Payer: Self-pay

## 2024-03-03 MED ORDER — CARVEDILOL 6.25 MG PO TABS: 6.2500 mg | ORAL_TABLET | Freq: Two times a day (BID) | ORAL | 1 refills | Status: DC

## 2024-03-03 NOTE — Addendum Note (Signed)
 Addended by: Jancarlo Biermann, Elmarie Shiley L on: 03/03/2024 08:16 AM   Modules accepted: Orders

## 2024-03-03 NOTE — Telephone Encounter (Signed)
 Resent prescription to [facility] in [place] per patient request.

## 2024-03-03 NOTE — Telephone Encounter (Signed)
 Pt asked this be sent to [facility]. He is completely out   [facility] - [place], [place] - [address]

## 2024-04-08 ENCOUNTER — Other Ambulatory Visit: Payer: Self-pay | Admitting: Cardiology

## 2024-05-01 ENCOUNTER — Ambulatory Visit: Payer: Self-pay | Attending: Cardiology | Admitting: Cardiology

## 2024-05-01 ENCOUNTER — Encounter: Payer: Self-pay | Admitting: Cardiology

## 2024-05-01 VITALS — BP 148/82 | HR 70 | Ht 69.0 in | Wt 203.4 lb

## 2024-05-01 DIAGNOSIS — Z951 Presence of aortocoronary bypass graft: Secondary | ICD-10-CM | POA: Diagnosis not present

## 2024-05-01 DIAGNOSIS — E785 Hyperlipidemia, unspecified: Secondary | ICD-10-CM

## 2024-05-01 DIAGNOSIS — I10 Essential (primary) hypertension: Secondary | ICD-10-CM

## 2024-05-01 DIAGNOSIS — I255 Ischemic cardiomyopathy: Secondary | ICD-10-CM | POA: Diagnosis not present

## 2024-05-01 MED ORDER — CARVEDILOL 6.25 MG PO TABS: 6.2500 mg | ORAL_TABLET | Freq: Two times a day (BID) | ORAL | 3 refills | Status: AC

## 2024-05-01 MED ORDER — CARVEDILOL 6.25 MG PO TABS: 6.2500 mg | ORAL_TABLET | Freq: Two times a day (BID) | ORAL | 0 refills | Status: DC

## 2024-05-01 NOTE — Patient Instructions (Addendum)
 Medication Instructions:  Your physician recommends that you continue on your current medications as directed. Please refer to the Current Medication list given to you today.  *If you need a refill on your cardiac medications before your next appointment, please call your pharmacy*   Lab Work: Your physician recommends that you return for lab work in: when fasting You need to have labs done when you are fasting.  You can come Monday through Friday 8:30 am to 12:00 pm and 1:15 to 4:30. You do not need to make an appointment as the order has already been placed. The labs you are going to have done are BMET, AST, ALT Lipids.    Testing/Procedures: None Ordered   Follow-Up: At [facility], you and your health needs are our priority.  As part of our continuing mission to provide you with exceptional heart care, we have created designated Provider Care Teams.  These Care Teams include your primary Cardiologist (physician) and Advanced Practice Providers (APPs -  Physician Assistants and Nurse Practitioners) who all work together to provide you with the care you need, when you need it.  We recommend signing up for the patient portal called "MyChart".  Sign up information is provided on this After Visit Summary.  MyChart is used to connect with patients for Virtual Visits (Telemedicine).  Patients are able to view lab/test results, encounter notes, upcoming appointments, etc.  Non-urgent messages can be sent to your provider as well.   To learn more about what you can do with MyChart, go to [email or web address].    Your next appointment:   3 month(s)  The format for your next appointment:   In Person  Provider:   Ralene Burger, MD    Other Instructions NA

## 2024-05-01 NOTE — Progress Notes (Signed)
 Cardiology Office Note:    Date:  05/01/2024   ID:  Calvin Rodriguez, DOB [birth year], MRN [id number]  PCP:  Georgean Kindle, MD  Cardiologist:  Ralene Burger, MD    Referring MD: Georgean Kindle, MD   Chief Complaint  Patient presents with   Follow-up   Medication Refill    History of Present Illness:    Calvin Rodriguez is a 58 y.o. male past medical history significant for coronary artery disease.  I met him first time 25 years ago when he was 58 years old and came to hospital with acute STEMI, that was addressed with PTCA and stenting then he disappeared from follow-up for many years and showed up a just about a year ago with symptoms with exertion including syncope.  Cardiac catheterization has been done he was found to have triple-vessel disease coronary bypass graft has been done with LIMA to LAD SVG to ramus intermedius, SVG to obtuse marginal SVG to PDA.  He was also found to have severely reduced left ventricular ejection fraction.  Since that time I am having difficulty putting him on the right medication and the reason for this is noncompliance.  He disappeared for more than a year eventually end up coming today to the office because he ran out of carvedilol .  He does not take Crestor  he does not take losartan .  He have no good explanation for this.  Likely clinically he seems to be doing well.  He works during the night at [employer].  He denies have any chest pain tightness squeezing pressure burning chest.  Recently started going to the gym on the regular basis.  No palpitations no dizziness no passing out  Past Medical History:  Diagnosis Date   Atherosclerosis of coronary artery bypass graft(s) without angina pectoris    Cardiac murmur, unspecified 03/09/2021   Chronic diastolic heart failure (HCC) 03/09/2021   Chronic fatigue syndrome 03/09/2021   Coronary artery disease    Coronary atherosclerosis 06/10/2020   COVID 11/24/2020   Dyslipidemia 03/09/2021   Encounter  for general adult medical examination without abnormal findings 03/09/2021   Essential hypertension 06/10/2020   GERD (gastroesophageal reflux disease)    Heart attack (HCC) 12/1998   Ischemic cardiomyopathy ejection fraction 30 to 35% as per patient 06/10/2020   Low back pain 03/09/2021   NSTEMI (non-ST elevated myocardial infarction) (HCC) 08/05/2020   Pain of testes 03/09/2021   Postop check 09/08/2020   S/P CABG x 4LIMA - LAD, SVG-RI, SVG-OM, SVG-PDA 2021 08/09/2020   Shoulder joint pain 03/09/2021   Smoking 06/10/2020   Syncope    Testicular hypofunction 03/09/2021   Urinary tract infectious disease 03/09/2021    Past Surgical History:  Procedure Laterality Date   CORONARY ANGIOPLASTY     2000 stent    CORONARY ARTERY BYPASS GRAFT N/A 08/09/2020   Procedure: CORONARY ARTERY BYPASS GRAFTING (CABG) x 4, LIMA TO LAD, SVG TO OM1, SVG TO RAMUS, SVG TO PDA;  Surgeon: Matt Song, Donata Fryer, MD;  Location: [facility] OR;  Service: Open Heart Surgery;  Laterality: N/A;   ENDOVEIN HARVEST OF GREATER SAPHENOUS VEIN Right 08/09/2020   Procedure: ENDOVEIN HARVEST OF GREATER SAPHENOUS VEIN;  Surgeon: Heriberto London, MD;  Location: [facility] OR;  Service: Open Heart Surgery;  Laterality: Right;   LEFT HEART CATH AND CORONARY ANGIOGRAPHY N/A 08/06/2020   Procedure: LEFT HEART CATH AND CORONARY ANGIOGRAPHY;  Surgeon: Swaziland, Peter M, MD;  Location: [facility] INVASIVE CV LAB;  Service:  Cardiovascular;  Laterality: N/A;   STENT PLACEMENT VASCULAR ([facility] HX)  12/1998   TEE WITHOUT CARDIOVERSION N/A 08/09/2020   Procedure: TRANSESOPHAGEAL ECHOCARDIOGRAM (TEE);  Surgeon: Matt Song, Donata Fryer, MD;  Location: [facility] OR;  Service: Open Heart Surgery;  Laterality: N/A;   TONSILLECTOMY      Current Medications: Current Meds  Medication Sig   acetaminophen  (TYLENOL ) 500 MG tablet Take 1,000 mg by mouth every 6 (six) hours as needed for mild pain.   aspirin  EC 81 MG tablet Take 1 tablet (81 mg total) by mouth daily. Swallow whole.   B Complex Vitamins (B  COMPLEX PO) Take 1 tablet by mouth daily. Unknown strength   carvedilol  (COREG ) 6.25 MG tablet Take 1 tablet (6.25 mg total) by mouth 2 (two) times daily with a meal. Must keep appt on 04/2024 for additional refills.   Coenzyme Q10 (COQ-10 PO) Take 1 capsule by mouth 2 (two) times daily.   GARLIC PO Take 1 tablet by mouth daily. Unknown strength   Multiple Vitamins-Minerals (MULTIVITAMIN WITH MINERALS) tablet Take 1 tablet by mouth daily. Unknown strenght   Omega-3 Fatty Acids (FISH OIL) 1000 MG CAPS Take 1,000 mg by mouth in the morning and at bedtime.   SOYA LECITHIN PO Take 1 capsule by mouth in the morning and at bedtime. Unknown strength   vitamin E 180 MG (400 UNITS) capsule Take 400 Units by mouth daily.   [DISCONTINUED] losartan  (COZAAR ) 25 MG tablet Take 1 tablet (25 mg total) by mouth daily.   [DISCONTINUED] rosuvastatin  (CRESTOR ) 40 MG tablet Take 1 tablet (40 mg total) by mouth daily.     Allergies:   Entresto [sacubitril-valsartan]   Social History   Socioeconomic History   Marital status: Single    Spouse name: Not on file   Number of children: Not on file   Years of education: Not on file   Highest education level: Not on file  Occupational History   Not on file  Tobacco Use   Smoking status: Former    Current packs/day: 0.00    Types: Cigarettes    Quit date: 08/05/2020    Years since quitting: 3.7   Smokeless tobacco: Never  Vaping Use   Vaping status: Never Used  Substance and Sexual Activity   Alcohol use: Never   Drug use: Never   Sexual activity: Not Currently  Other Topics Concern   Not on file  Social History Narrative   Not on file   Social Drivers of Health   Financial Resource Strain: Not on file  Food Insecurity: Not on file  Transportation Needs: Not on file  Physical Activity: Not on file  Stress: Not on file  Social Connections: Not on file     Family History: The patient's family history includes Heart disease in his father and  mother. ROS:   Please see the history of present illness.    All 14 point review of systems negative except as described per history of present illness  EKGs/Labs/Other Studies Reviewed:    EKG Interpretation Date/Time:  Thursday May 01 2024 08:26:27 EDT Ventricular Rate:  70 PR Interval:  164 QRS Duration:  144 QT Interval:  390 QTC Calculation: 421 R Axis:   -82  Text Interpretation: Sinus rhythm with Premature atrial complexes with ventricular escape complexes Left axis deviation Non-specific intra-ventricular conduction block Cannot rule out Septal infarct (cited on or before 06-Aug-2020) Abnormal ECG When compared with ECG of 10-Aug-2020 06:40, Significant changes have occurred Confirmed by Gordan Latina,  Porfirio Bristol [phone]) on 05/01/2024 8:44:20 AM    Recent Labs: No results found for requested labs within last 365 days.  Recent Lipid Panel    Component Value Date/Time   CHOL 173 12/25/2022 0847   TRIG 146 12/25/2022 0847   HDL 40 12/25/2022 0847   CHOLHDL 4.3 12/25/2022 0847   CHOLHDL 4.9 08/06/2020 0639   VLDL 17 08/06/2020 0639   LDLCALC 107 (H) 12/25/2022 0847    Physical Exam:    VS:  BP (!) 148/82 (BP Location: Right Arm, Patient Position: Sitting)   Pulse 70   Ht 5\' 9"  (1.753 m)   Wt 203 lb 6.4 oz (92.3 kg)   SpO2 96%   BMI 30.04 kg/m     Wt Readings from Last 3 Encounters:  05/01/24 203 lb 6.4 oz (92.3 kg)  12/19/22 190 lb 3.2 oz (86.3 kg)  06/20/21 202 lb 12.8 oz (92 kg)     GEN:  Well nourished, well developed in no acute distress HEENT: Normal NECK: No JVD; No carotid bruits LYMPHATICS: No lymphadenopathy CARDIAC: RRR, no murmurs, no rubs, no gallops RESPIRATORY:  Clear to auscultation without rales, wheezing or rhonchi  ABDOMEN: Soft, non-tender, non-distended MUSCULOSKELETAL:  No edema; No deformity  SKIN: Warm and dry LOWER EXTREMITIES: no swelling NEUROLOGIC:  Alert and oriented x 3 PSYCHIATRIC:  Normal affect   ASSESSMENT:    1. Essential  hypertension   2. Ischemic cardiomyopathy ejection fraction 30 to 35% as per patient   3. S/P CABG x 4LIMA - LAD, SVG-RI, SVG-OM, SVG-PDA 2021   4. Dyslipidemia    PLAN:    In order of problems listed above:  Coronary artery disease.  Likely he is asymptomatic, on antiplatelet therapy which I will continue. Cardiomyopathy I wanted him to have an echocardiogram done he said that he is not able to afford it.  I was able to take him to the room and take a quick look with echocardiogram sadly his ejection fraction still diminished about 30%.  There is a big area of akinesis involving LAD territory.  I will ask Woodley to have Chem-7 done if Chem-7 is fine we will initiate Entresto.  He is reluctant to add any more medications because he is feeling well but I stressed importance of taking those medications.  Lastly he is very willing to continue with carvedilol . Dyslipidemia again I had a long discussion with him about the fact that he to take medications, it looks like he cannot tolerate statin therefore we will check fasting lipid profile will initiate PCSK9 agent if he allows me to. Noncompliance make me very worried.  I see him back in my office in about 3 months.  We also had discussion about potential ICD he still does not want to have it however mild.  He can benefit from BiV ICD   Medication Adjustments/Labs and Tests Ordered: Current medicines are reviewed at length with the patient today.  Concerns regarding medicines are outlined above.  Orders Placed This Encounter  Procedures   EKG 12-Lead   Medication changes: No orders of the defined types were placed in this encounter.   Signed, Manfred Seed, MD, [facility] 05/01/2024 8:44 AM    [facility]

## 2024-08-01 ENCOUNTER — Ambulatory Visit: Admitting: Cardiology

## 2024-10-27 ENCOUNTER — Ambulatory Visit: Admitting: Cardiology

## 2024-12-29 ENCOUNTER — Ambulatory Visit: Payer: Self-pay | Admitting: Cardiology
